# Patient Record
Sex: Male | Born: 1985
Health system: Southern US, Community
[De-identification: ages and names within clinical notes are randomized; demographics above are authoritative.]

## PROBLEM LIST (undated history)

## (undated) DIAGNOSIS — Z87442 Personal history of urinary calculi: Secondary | ICD-10-CM

## (undated) DIAGNOSIS — N200 Calculus of kidney: Secondary | ICD-10-CM

---
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
## 2018-09-27 DIAGNOSIS — R61 Generalized hyperhidrosis: Secondary | ICD-10-CM | POA: Diagnosis not present

## 2019-05-29 ENCOUNTER — Other Ambulatory Visit: Payer: Self-pay | Admitting: Urology

## 2019-05-29 DIAGNOSIS — R310 Gross hematuria: Secondary | ICD-10-CM

## 2019-06-08 ENCOUNTER — Ambulatory Visit: Payer: 59

## 2019-06-12 ENCOUNTER — Ambulatory Visit
Admission: RE | Admit: 2019-06-12 | Discharge: 2019-06-12 | Disposition: A | Discharge status: Home / Self Care | Payer: 59 | Source: Ambulatory Visit | Attending: Urology | Admitting: Urology

## 2019-06-12 ENCOUNTER — Other Ambulatory Visit: Payer: Self-pay

## 2019-06-12 DIAGNOSIS — R310 Gross hematuria: Secondary | ICD-10-CM | POA: Diagnosis present

## 2019-06-12 MED ORDER — IOHEXOL 300 MG/ML  SOLN
150.0000 mL | Freq: Once | INTRAMUSCULAR | Status: AC | PRN
  Administered 2019-06-12: 150 mL via INTRAVENOUS

## 2019-06-25 ENCOUNTER — Encounter: Payer: Self-pay | Admitting: *Deleted

## 2019-06-26 MED ORDER — SUGAMMADEX SODIUM 200 MG/2ML IV SOLN
INTRAVENOUS | Status: AC
  Filled 2019-06-26: qty 2

## 2019-06-27 MED ORDER — PROPOFOL 500 MG/50ML IV EMUL
INTRAVENOUS | Status: AC
  Filled 2019-06-27: qty 100

## 2019-06-29 MED ORDER — EPHEDRINE SULFATE 50 MG/ML IJ SOLN
INTRAMUSCULAR | Status: AC
  Filled 2019-06-29: qty 1

## 2019-06-29 MED ORDER — GLYCOPYRROLATE 0.2 MG/ML IJ SOLN
INTRAMUSCULAR | Status: AC
  Filled 2019-06-29: qty 1

## 2019-06-29 MED ORDER — LIDOCAINE HCL (PF) 2 % IJ SOLN
INTRAMUSCULAR | Status: AC
  Filled 2019-06-29: qty 10

## 2019-06-29 MED ORDER — PROPOFOL 500 MG/50ML IV EMUL
INTRAVENOUS | Status: AC
  Filled 2019-06-29: qty 200

## 2019-07-05 ENCOUNTER — Ambulatory Visit
Admission: RE | Admit: 2019-07-05 | Discharge: 2019-07-05 | Disposition: A | Discharge status: Home / Self Care | Payer: 59 | Attending: Urology | Admitting: Urology

## 2019-07-05 ENCOUNTER — Ambulatory Visit: Admit: 2019-07-05 | Payer: 59 | Admitting: Urology

## 2019-07-05 ENCOUNTER — Other Ambulatory Visit: Payer: Self-pay

## 2019-07-05 ENCOUNTER — Encounter
Admission: RE | Disposition: A | Discharge status: Home / Self Care | Payer: Self-pay | Source: Home / Self Care | Attending: Urology

## 2019-07-05 DIAGNOSIS — N200 Calculus of kidney: Secondary | ICD-10-CM | POA: Insufficient documentation

## 2019-07-05 DIAGNOSIS — N201 Calculus of ureter: Secondary | ICD-10-CM | POA: Diagnosis present

## 2019-07-05 DIAGNOSIS — Z87891 Personal history of nicotine dependence: Secondary | ICD-10-CM | POA: Insufficient documentation

## 2019-07-05 HISTORY — PX: EXTRACORPOREAL SHOCK WAVE LITHOTRIPSY: SHX1557

## 2019-07-05 SURGERY — LITHOTRIPSY, ESWL
Anesthesia: Moderate Sedation | Laterality: Left

## 2019-07-05 MED ORDER — MORPHINE SULFATE (PF) 10 MG/ML IV SOLN
INTRAVENOUS | Status: AC
  Administered 2019-07-05: 14:00:00 10 mg via INTRAMUSCULAR
  Filled 2019-07-05: qty 1

## 2019-07-05 MED ORDER — DIPHENHYDRAMINE HCL 25 MG PO CAPS
ORAL_CAPSULE | ORAL | Status: AC
  Administered 2019-07-05: 14:00:00 25 mg via ORAL
  Filled 2019-07-05: qty 1

## 2019-07-05 MED ORDER — FUROSEMIDE 10 MG/ML IJ SOLN
10.0000 mg | Freq: Once | INTRAMUSCULAR | Status: AC
  Administered 2019-07-05: 17:00:00 10 mg via INTRAVENOUS

## 2019-07-05 MED ORDER — FUROSEMIDE 10 MG/ML IJ SOLN
INTRAMUSCULAR | Status: AC
  Administered 2019-07-05: 17:00:00 10 mg via INTRAVENOUS
  Filled 2019-07-05: qty 2

## 2019-07-05 MED ORDER — DIPHENHYDRAMINE HCL 25 MG PO CAPS
25.0000 mg | ORAL_CAPSULE | ORAL | Status: AC
  Administered 2019-07-05: 14:00:00 25 mg via ORAL

## 2019-07-05 MED ORDER — PROMETHAZINE HCL 25 MG/ML IJ SOLN
INTRAMUSCULAR | Status: AC
  Administered 2019-07-05: 14:00:00 25 mg via INTRAMUSCULAR
  Filled 2019-07-05: qty 1

## 2019-07-05 MED ORDER — MIDAZOLAM HCL 2 MG/2ML IJ SOLN
1.0000 mg | Freq: Once | INTRAMUSCULAR | Status: AC
  Administered 2019-07-05: 1 mg via INTRAMUSCULAR

## 2019-07-05 MED ORDER — LEVOFLOXACIN 500 MG PO TABS
ORAL_TABLET | ORAL | Status: AC
  Administered 2019-07-05: 14:00:00 500 mg via ORAL
  Filled 2019-07-05: qty 1

## 2019-07-05 MED ORDER — ONDANSETRON HCL 4 MG PO TABS
ORAL_TABLET | ORAL | Status: AC
  Administered 2019-07-05: 18:00:00 4 mg via ORAL
  Filled 2019-07-05: qty 1

## 2019-07-05 MED ORDER — TAMSULOSIN HCL 0.4 MG PO CAPS: 0.4000 mg | ORAL_CAPSULE | Freq: Every day | ORAL | 1 refills | Status: DC

## 2019-07-05 MED ORDER — PROMETHAZINE HCL 25 MG/ML IJ SOLN
25.0000 mg | Freq: Once | INTRAMUSCULAR | Status: AC
  Administered 2019-07-05: 25 mg via INTRAMUSCULAR

## 2019-07-05 MED ORDER — MORPHINE SULFATE (PF) 10 MG/ML IV SOLN
10.0000 mg | Freq: Once | INTRAVENOUS | Status: AC
  Administered 2019-07-05: 14:00:00 10 mg via INTRAMUSCULAR

## 2019-07-05 MED ORDER — MIDAZOLAM HCL 2 MG/2ML IJ SOLN
INTRAMUSCULAR | Status: AC
  Administered 2019-07-05: 14:00:00 1 mg via INTRAMUSCULAR
  Filled 2019-07-05: qty 2

## 2019-07-05 MED ORDER — ACETAMINOPHEN-CODEINE #3 300-30 MG PO TABS: 1.0000 | ORAL_TABLET | ORAL | 0 refills | Status: DC | PRN

## 2019-07-05 MED ORDER — LEVOFLOXACIN 500 MG PO TABS
500.0000 mg | ORAL_TABLET | Freq: Every day | ORAL | Status: DC
  Administered 2019-07-05: 500 mg via ORAL

## 2019-07-05 MED ORDER — DEXTROSE-NACL 5-0.45 % IV SOLN
INTRAVENOUS | Status: DC
  Administered 2019-07-05: 14:00:00 via INTRAVENOUS

## 2019-07-05 MED ORDER — ONDANSETRON HCL 4 MG PO TABS
4.0000 mg | ORAL_TABLET | Freq: Once | ORAL | Status: AC
  Administered 2019-07-05: 18:00:00 4 mg via ORAL

## 2019-07-05 NOTE — Progress Notes (Signed)
Patient extremely drowsy. Oxygen level dropped to 69% on RA , placed on 4 L [place]. Did come up to 100%. Patient snoring loudly. Noticed tongue does go to the roof of his mouth obstructing his airway. EtCO2 42.   Wife notified patient is out and very sleepy. Will call her once patient wakes up more.

## 2019-07-05 NOTE — Discharge Instructions (Signed)
Kidney Stones ° °Kidney stones (urolithiasis) are solid, rock-like deposits that form inside of the organs that make urine (kidneys). A kidney stone may form in a kidney and move into the bladder, where it can cause intense pain and block the flow of urine. Kidney stones are created when high levels of certain minerals are found in the urine. They are usually passed through urination, but in some cases, medical treatment may be needed to remove them. °What are the causes? °Kidney stones may be caused by: °· A condition in which certain glands produce too much parathyroid hormone (primary hyperparathyroidism), which causes too much calcium buildup in the blood. °· Buildup of uric acid crystals in the bladder (hyperuricosuria). Uric acid is a chemical that the body produces when you eat certain foods. It usually exits the body in the urine. °· Narrowing (stricture) of one or both of the tubes that drain urine from the kidneys to the bladder (ureters). °· A kidney blockage that is present at birth (congenital obstruction). °· Past surgery on the kidney or the ureters, such as gastric bypass surgery. °What increases the risk? °The following factors make you more likely to develop kidney stones: °· Having had a kidney stone in the past. °· Having a family history of kidney stones. °· Not drinking enough water. °· Eating a diet that is high in protein, salt (sodium), or sugar. °· Being overweight or obese. °What are the signs or symptoms? °Symptoms of a kidney stone may include: °· Nausea. °· Vomiting. °· Blood in the urine (hematuria). °· Pain in the side of the abdomen, right below the ribs (flank pain). Pain usually spreads (radiates) to the groin. °· Needing to urinate frequently or urgently. °How is this diagnosed? °This condition may be diagnosed based on: °· Your medical history. °· A physical exam. °· Blood tests. °· Urine tests. °· CT scan. °· Abdominal X-ray. °· A procedure to examine the inside of the bladder  (cystoscopy). °How is this treated? °Treatment for kidney stones depends on the size, location, and makeup of the stones. Treatment may involve: °· Analyzing your urine before and after you pass the stone through urination. °· Being monitored at the hospital until you pass the stone through urination. °· Increasing your fluid intake and decreasing the amount of calcium and protein in your diet. °· A procedure to break up kidney stones in the bladder using: °? A focused beam of light (laser therapy). °? Shock waves (extracorporeal shock wave lithotripsy). °· Surgery to remove kidney stones. This may be needed if you have severe pain or have stones that block your urinary tract. °Follow these instructions at home: °Eating and drinking °· Drink enough fluid to keep your urine clear or pale yellow. This will help you to pass the kidney stone. °· If directed, change your diet. This may include: °? Limiting how much sodium you eat. °? Eating more fruits and vegetables. °? Limiting how much meat, poultry, fish, and eggs you eat. °· Follow instructions from your health care provider about eating or drinking restrictions. °General instructions °· Collect urine samples as told by your health care provider. You may need to collect a urine sample: °? 24 hours after you pass the stone. °? 8-12 weeks after passing the kidney stone, and every 6-12 months after that. °· Strain your urine every time you urinate, for as long as directed. Use the strainer that your health care provider recommends. °· Do not throw out the kidney stone after passing   it. Keep the stone so it can be tested by your health care provider. Testing the makeup of your kidney stone may help prevent you from getting kidney stones in the future. °· Take over-the-counter and prescription medicines only as told by your health care provider. °· Keep all follow-up visits as told by your health care provider. This is important. You may need follow-up X-rays or  ultrasounds to make sure that your stone has passed. °How is this prevented? °To prevent another kidney stone: °· Drink enough fluid to keep your urine clear or pale yellow. This is the best way to prevent kidney stones. °· Eat a healthy diet and follow recommendations from your health care provider about foods to avoid. You may be instructed to eat a low-protein diet. Recommendations vary depending on the type of kidney stone that you have. °· Maintain a healthy weight. °Contact a health care provider if: °· You have pain that gets worse or does not get better with medicine. °Get help right away if: °· You have a fever or chills. °· You develop severe pain. °· You develop new abdominal pain. °· You faint. °· You are unable to urinate. °This information is not intended to replace advice given to you by your health care provider. Make sure you discuss any questions you have with your health care provider. °Document Released: 12/06/2005 Document Revised: 07/18/2018 Document Reviewed: 05/21/2016 °[employer] Patient Education © 2020 [employer] ° °Lithotripsy, Care After °This sheet gives you information about how to care for yourself after your procedure. Your health care provider may also give you more specific instructions. If you have problems or questions, contact your health care provider. °What can I expect after the procedure? °After the procedure, it is common to have: °· Some blood in your urine. This should only last for a few days. °· Soreness in your back, sides, or upper abdomen for a few days. °· Blotches or bruises on your back where the pressure wave entered the skin. °· Pain, discomfort, or nausea when pieces (fragments) of the kidney stone move through the tube that carries urine from the kidney to the bladder (ureter). Stone fragments may pass soon after the procedure, but they may continue to pass for up to 4-8 weeks. °? If you have severe pain or nausea, contact your health care provider. This may  be caused by a large stone that was not broken up, and this may mean that you need more treatment. °· Some pain or discomfort during urination. °· Some pain or discomfort in the lower abdomen or (in men) at the base of the penis. °Follow these instructions at home: °Medicines °· Take over-the-counter and prescription medicines only as told by your health care provider. °· If you were prescribed an antibiotic medicine, take it as told by your health care provider. Do not stop taking the antibiotic even if you start to feel better. °· Do not drive for 24 hours if you were given a medicine to help you relax (sedative). °· Do not drive or use heavy machinery while taking prescription pain medicine. °Eating and drinking ° °  ° °· Drink enough water and fluids to keep your urine clear or pale yellow. This helps any remaining pieces of the stone to pass. It can also help prevent new stones from forming. °· Eat plenty of fresh fruits and vegetables. °· Follow instructions from your health care provider about eating and drinking restrictions. You may be instructed: °? To reduce how much   salt (sodium) you eat or drink. Check ingredients and nutrition facts on packaged foods and beverages. °? To reduce how much meat you eat. °· Eat the recommended amount of calcium for your age and gender. Ask your health care provider how much calcium you should have. °General instructions °· Get plenty of rest. °· Most people can resume normal activities 1-2 days after the procedure. Ask your health care provider what activities are safe for you. °· Your health care provider may direct you to lie in a certain position (postural drainage) and tap firmly (percuss) over your kidney area to help stone fragments pass. Follow instructions as told by your health care provider. °· If directed, strain all urine through the strainer that was provided by your health care provider. °? Keep all fragments for your health care provider to see. Any stones  that are found may be sent to a medical lab for examination. The stone may be as small as a grain of salt. °· Keep all follow-up visits as told by your health care provider. This is important. °Contact a health care provider if: °· You have pain that is severe or does not get better with medicine. °· You have nausea that is severe or does not go away. °· You have blood in your urine longer than your health care provider told you to expect. °· You have more blood in your urine. °· You have pain during urination that does not go away. °· You urinate more frequently than usual and this does not go away. °· You develop a rash or any other possible signs of an allergic reaction. °Get help right away if: °· You have severe pain in your back, sides, or upper abdomen. °· You have severe pain while urinating. °· Your urine is very dark red. °· You have blood in your stool (feces). °· You cannot pass any urine at all. °· You feel a strong urge to urinate after emptying your bladder. °· You have a fever or chills. °· You develop shortness of breath, difficulty breathing, or chest pain. °· You have severe nausea that leads to persistent vomiting. °· You faint. °Summary °· After this procedure, it is common to have some pain, discomfort, or nausea when pieces (fragments) of the kidney stone move through the tube that carries urine from the kidney to the bladder (ureter). If this pain or nausea is severe, however, you should contact your health care provider. °· Most people can resume normal activities 1-2 days after the procedure. Ask your health care provider what activities are safe for you. °· Drink enough water and fluids to keep your urine clear or pale yellow. This helps any remaining pieces of the stone to pass, and it can help prevent new stones from forming. °· If directed, strain your urine and keep all fragments for your health care provider to see. Fragments or stones may be as small as a grain of salt. °· Get help  right away if you have severe pain in your back, sides, or upper abdomen or have severe pain while urinating. °This information is not intended to replace advice given to you by your health care provider. Make sure you discuss any questions you have with your health care provider. °Document Released: 12/26/2007 Document Revised: 03/19/2019 Document Reviewed: 10/27/2016 °[employer] Patient Education © 2020 [employer] ° °

## 2019-07-06 ENCOUNTER — Encounter: Payer: Self-pay | Admitting: Urology

## 2019-09-12 ENCOUNTER — Other Ambulatory Visit: Payer: Self-pay

## 2019-09-12 DIAGNOSIS — Z20822 Contact with and (suspected) exposure to covid-19: Secondary | ICD-10-CM

## 2019-09-13 LAB — NOVEL CORONAVIRUS, NAA: SARS-CoV-2, NAA: NOT DETECTED

## 2019-10-15 ENCOUNTER — Other Ambulatory Visit: Payer: Self-pay

## 2019-10-17 ENCOUNTER — Other Ambulatory Visit: Payer: Self-pay

## 2019-10-17 ENCOUNTER — Encounter: Payer: Self-pay | Admitting: Internal Medicine

## 2019-10-17 ENCOUNTER — Ambulatory Visit: Payer: 59 | Admitting: Internal Medicine

## 2019-10-17 VITALS — BP 126/84 | HR 62 | Temp 98.3°F | Ht 69.0 in | Wt 178.8 lb

## 2019-10-17 DIAGNOSIS — Z1329 Encounter for screening for other suspected endocrine disorder: Secondary | ICD-10-CM | POA: Diagnosis not present

## 2019-10-17 DIAGNOSIS — Z1389 Encounter for screening for other disorder: Secondary | ICD-10-CM

## 2019-10-17 DIAGNOSIS — Z13220 Encounter for screening for lipoid disorders: Secondary | ICD-10-CM | POA: Diagnosis not present

## 2019-10-17 DIAGNOSIS — Q2112 Patent foramen ovale: Secondary | ICD-10-CM

## 2019-10-17 DIAGNOSIS — R42 Dizziness and giddiness: Secondary | ICD-10-CM | POA: Insufficient documentation

## 2019-10-17 DIAGNOSIS — Q211 Atrial septal defect: Secondary | ICD-10-CM

## 2019-10-17 DIAGNOSIS — R030 Elevated blood-pressure reading, without diagnosis of hypertension: Secondary | ICD-10-CM | POA: Insufficient documentation

## 2019-10-17 DIAGNOSIS — Z0000 Encounter for general adult medical examination without abnormal findings: Secondary | ICD-10-CM

## 2019-10-17 DIAGNOSIS — E559 Vitamin D deficiency, unspecified: Secondary | ICD-10-CM | POA: Diagnosis not present

## 2019-10-17 DIAGNOSIS — R079 Chest pain, unspecified: Secondary | ICD-10-CM | POA: Insufficient documentation

## 2019-10-17 NOTE — Patient Instructions (Addendum)
Goal blood pressure <130/<80 [place] [address] [place] [facility]   Nonspecific Chest Pain, Adult Chest pain can be caused by many different conditions. It can be caused by a condition that is life-threatening and requires treatment right away. It can also be caused by something that is not life-threatening. If you have chest pain, it can be hard to know the difference, so it is important to get help right away to make sure that you do not have a serious condition. Some life-threatening causes of chest pain include:  Heart attack.  A tear in the body's main blood vessel (aortic dissection).  Inflammation around your heart (pericarditis).  A problem in the lungs, such as a blood clot (pulmonary embolism) or a collapsed lung (pneumothorax). Some non life-threatening causes of chest pain include:  Heartburn.  Anxiety or stress.  Damage to the bones, muscles, and cartilage that make up your chest wall.  Pneumonia or bronchitis.  Shingles infection (varicella-zoster virus). Chest pain can feel like:  Pain or discomfort on the surface of your chest or deep in your chest.  Crushing, pressure, aching, or squeezing pain.  Burning or tingling.  Dull or sharp pain that is worse when you move, cough, or take a deep breath.  Pain or discomfort that is also felt in your back, neck, jaw, shoulder, or arm, or pain that spreads to any of these areas. Your chest pain may come and go. It may also be constant. Your health care provider will do lab tests and other studies to find the cause of your pain. Treatment will depend on the cause of your chest pain. Follow these instructions at home: Medicines  Take over-the-counter and prescription medicines only as told by your health care provider.  If you were prescribed an antibiotic, take it as told by your health care provider. Do not stop taking the antibiotic even if you start to feel better. Lifestyle   Rest as directed by  your health care provider.  Do not use any products that contain nicotine or tobacco, such as cigarettes and e-cigarettes. If you need help quitting, ask your health care provider.  Do not drink alcohol.  Make healthy lifestyle choices as recommended. These may include: ? Getting regular exercise. Ask your health care provider to suggest some activities that are safe for you. ? Eating a heart-healthy diet. This includes plenty of fresh fruits and vegetables, whole grains, low-fat (lean) protein, and low-fat dairy products. A dietitian can help you find healthy eating options. ? Maintaining a healthy weight. ? Managing any other health conditions you have, such as high blood pressure (hypertension) or diabetes. ? Reducing stress, such as with yoga or relaxation techniques. General instructions  Pay attention to any changes in your symptoms. Tell your health care provider about them or any new symptoms.  Avoid any activities that cause chest pain.  Keep all follow-up visits as told by your health care provider. This is important. This includes visits for any further testing if your chest pain does not go away. Contact a health care provider if:  Your chest pain does not go away.  You feel depressed.  You have a fever. Get help right away if:  Your chest pain gets worse.  You have a cough that gets worse, or you cough up blood.  You have severe pain in your abdomen.  You faint.  You have sudden, unexplained chest discomfort.  You have sudden, unexplained discomfort in your arms, back, neck, or  jaw.  You have shortness of breath at any time.  You suddenly start to sweat, or your skin gets clammy.  You feel nausea or you vomit.  You suddenly feel lightheaded or dizzy.  You have severe weakness, or unexplained weakness or fatigue.  Your heart begins to beat quickly, or it feels like it is skipping beats. These symptoms may represent a serious problem that is an emergency.  Do not wait to see if the symptoms will go away. Get medical help right away. Call your local emergency services (911 in the [place] Do not drive yourself to the hospital. Summary  Chest pain can be caused by a condition that is serious and requires urgent treatment. It may also be caused by something that is not life-threatening.  If you have chest pain, it is very important to see your health care provider. Your health care provider may do lab tests and other studies to find the cause of your pain.  Follow your health care provider's instructions on taking medicines, making lifestyle changes, and getting emergency treatment if symptoms become worse.  Keep all follow-up visits as told by your health care provider. This includes visits for any further testing if your chest pain does not go away. This information is not intended to replace advice given to you by your health care provider. Make sure you discuss any questions you have with your health care provider. Document Released: 09/15/2005 Document Revised: 06/08/2018 Document Reviewed: 06/08/2018 Elsevier Patient Education  2020 [employer].  Hypertension, Adult High blood pressure (hypertension) is when the force of blood pumping through the arteries is too strong. The arteries are the blood vessels that carry blood from the heart throughout the body. Hypertension forces the heart to work harder to pump blood and may cause arteries to become narrow or stiff. Untreated or uncontrolled hypertension can cause a heart attack, heart failure, a stroke, kidney disease, and other problems. A blood pressure reading consists of a higher number over a lower number. Ideally, your blood pressure should be below 120/80. The first ("top") number is called the systolic pressure. It is a measure of the pressure in your arteries as your heart beats. The second ("bottom") number is called the diastolic pressure. It is a measure of the pressure in your arteries as  the heart relaxes. What are the causes? The exact cause of this condition is not known. There are some conditions that result in or are related to high blood pressure. What increases the risk? Some risk factors for high blood pressure are under your control. The following factors may make you more likely to develop this condition:  Smoking.  Having type 2 diabetes mellitus, high cholesterol, or both.  Not getting enough exercise or physical activity.  Being overweight.  Having too much fat, sugar, calories, or salt (sodium) in your diet.  Drinking too much alcohol. Some risk factors for high blood pressure may be difficult or impossible to change. Some of these factors include:  Having chronic kidney disease.  Having a family history of high blood pressure.  Age. Risk increases with age.  Race. You may be at higher risk if you are African American.  Gender. Men are at higher risk than women before age 43. After age 26, women are at higher risk than men.  Having obstructive sleep apnea.  Stress. What are the signs or symptoms? High blood pressure may not cause symptoms. Very high blood pressure (hypertensive crisis) may cause:  Headache.  Anxiety.  Shortness of breath.  Nosebleed.  Nausea and vomiting.  Vision changes.  Severe chest pain.  Seizures. How is this diagnosed? This condition is diagnosed by measuring your blood pressure while you are seated, with your arm resting on a flat surface, your legs uncrossed, and your feet flat on the floor. The cuff of the blood pressure monitor will be placed directly against the skin of your upper arm at the level of your heart. It should be measured at least twice using the same arm. Certain conditions can cause a difference in blood pressure between your right and left arms. Certain factors can cause blood pressure readings to be lower or higher than normal for a short period of time:  When your blood pressure is higher  when you are in a health care provider's office than when you are at home, this is called white coat hypertension. Most people with this condition do not need medicines.  When your blood pressure is higher at home than when you are in a health care provider's office, this is called masked hypertension. Most people with this condition may need medicines to control blood pressure. If you have a high blood pressure reading during one visit or you have normal blood pressure with other risk factors, you may be asked to:  Return on a different day to have your blood pressure checked again.  Monitor your blood pressure at home for 1 week or longer. If you are diagnosed with hypertension, you may have other blood or imaging tests to help your health care provider understand your overall risk for other conditions. How is this treated? This condition is treated by making healthy lifestyle changes, such as eating healthy foods, exercising more, and reducing your alcohol intake. Your health care provider may prescribe medicine if lifestyle changes are not enough to get your blood pressure under control, and if:  Your systolic blood pressure is above 130.  Your diastolic blood pressure is above 80. Your personal target blood pressure may vary depending on your medical conditions, your age, and other factors. Follow these instructions at home: Eating and drinking   Eat a diet that is high in fiber and potassium, and low in sodium, added sugar, and fat. An example eating plan is called the DASH (Dietary Approaches to Stop Hypertension) diet. To eat this way: ? Eat plenty of fresh fruits and vegetables. Try to fill one half of your plate at each meal with fruits and vegetables. ? Eat whole grains, such as whole-wheat pasta, brown rice, or whole-grain bread. Fill about one fourth of your plate with whole grains. ? Eat or drink low-fat dairy products, such as skim milk or low-fat yogurt. ? Avoid fatty cuts of  meat, processed or cured meats, and poultry with skin. Fill about one fourth of your plate with lean proteins, such as fish, chicken without skin, beans, eggs, or tofu. ? Avoid pre-made and processed foods. These tend to be higher in sodium, added sugar, and fat.  Reduce your daily sodium intake. Most people with hypertension should eat less than 1,500 mg of sodium a day.  Do not drink alcohol if: ? Your health care provider tells you not to drink. ? You are pregnant, may be pregnant, or are planning to become pregnant.  If you drink alcohol: ? Limit how much you use to:  0-1 drink a day for women.  0-2 drinks a day for men. ? Be aware of how much alcohol is in your drink. In  the [place] one drink equals one 12 oz bottle of beer (355 mL), one 5 oz glass of wine (148 mL), or one 1 oz glass of hard liquor (44 mL). Lifestyle   Work with your health care provider to maintain a healthy body weight or to lose weight. Ask what an ideal weight is for you.  Get at least 30 minutes of exercise most days of the week. Activities may include walking, swimming, or biking.  Include exercise to strengthen your muscles (resistance exercise), such as Pilates or lifting weights, as part of your weekly exercise routine. Try to do these types of exercises for 30 minutes at least 3 days a week.  Do not use any products that contain nicotine or tobacco, such as cigarettes, e-cigarettes, and chewing tobacco. If you need help quitting, ask your health care provider.  Monitor your blood pressure at home as told by your health care provider.  Keep all follow-up visits as told by your health care provider. This is important. Medicines  Take over-the-counter and prescription medicines only as told by your health care provider. Follow directions carefully. Blood pressure medicines must be taken as prescribed.  Do not skip doses of blood pressure medicine. Doing this puts you at risk for problems and can make the  medicine less effective.  Ask your health care provider about side effects or reactions to medicines that you should watch for. Contact a health care provider if you:  Think you are having a reaction to a medicine you are taking.  Have headaches that keep coming back (recurring).  Feel dizzy.  Have swelling in your ankles.  Have trouble with your vision. Get help right away if you:  Develop a severe headache or confusion.  Have unusual weakness or numbness.  Feel faint.  Have severe pain in your chest or abdomen.  Vomit repeatedly.  Have trouble breathing. Summary  Hypertension is when the force of blood pumping through your arteries is too strong. If this condition is not controlled, it may put you at risk for serious complications.  Your personal target blood pressure may vary depending on your medical conditions, your age, and other factors. For most people, a normal blood pressure is less than 120/80.  Hypertension is treated with lifestyle changes, medicines, or a combination of both. Lifestyle changes include losing weight, eating a healthy, low-sodium diet, exercising more, and limiting alcohol. This information is not intended to replace advice given to you by your health care provider. Make sure you discuss any questions you have with your health care provider. Document Released: 12/06/2005 Document Revised: 08/16/2018 Document Reviewed: 08/16/2018 Elsevier Patient Education  2020 [place] DASH stands for "Dietary Approaches to Stop Hypertension." The DASH eating plan is a healthy eating plan that has been shown to reduce high blood pressure (hypertension). It may also reduce your risk for type 2 diabetes, heart disease, and stroke. The DASH eating plan may also help with weight loss. What are tips for following this plan?  General guidelines  Avoid eating more than 2,300 mg (milligrams) of salt (sodium) a day. If you have hypertension, you  may need to reduce your sodium intake to 1,500 mg a day.  Limit alcohol intake to no more than 1 drink a day for nonpregnant women and 2 drinks a day for men. One drink equals 12 oz of beer, 5 oz of wine, or 1 oz of hard liquor.  Work with your health care provider to maintain  a healthy body weight or to lose weight. Ask what an ideal weight is for you.  Get at least 30 minutes of exercise that causes your heart to beat faster (aerobic exercise) most days of the week. Activities may include walking, swimming, or biking.  Work with your health care provider or diet and nutrition specialist (dietitian) to adjust your eating plan to your individual calorie needs. Reading food labels   Check food labels for the amount of sodium per serving. Choose foods with less than 5 percent of the Daily Value of sodium. Generally, foods with less than 300 mg of sodium per serving fit into this eating plan.  To find whole grains, look for the word "whole" as the first word in the ingredient list. Shopping  Buy products labeled as "low-sodium" or "no salt added."  Buy fresh foods. Avoid canned foods and premade or frozen meals. Cooking  Avoid adding salt when cooking. Use salt-free seasonings or herbs instead of table salt or sea salt. Check with your health care provider or pharmacist before using salt substitutes.  Do not fry foods. Cook foods using healthy methods such as baking, boiling, grilling, and broiling instead.  Cook with heart-healthy oils, such as olive, canola, soybean, or sunflower oil. Meal planning  Eat a balanced diet that includes: ? 5 or more servings of fruits and vegetables each day. At each meal, try to fill half of your plate with fruits and vegetables. ? Up to 6-8 servings of whole grains each day. ? Less than 6 oz of lean meat, poultry, or fish each day. A 3-oz serving of meat is about the same size as a deck of cards. One egg equals 1 oz. ? 2 servings of low-fat dairy each  day. ? A serving of nuts, seeds, or beans 5 times each week. ? Heart-healthy fats. Healthy fats called Omega-3 fatty acids are found in foods such as flaxseeds and coldwater fish, like sardines, salmon, and mackerel.  Limit how much you eat of the following: ? Canned or prepackaged foods. ? Food that is high in trans fat, such as fried foods. ? Food that is high in saturated fat, such as fatty meat. ? Sweets, desserts, sugary drinks, and other foods with added sugar. ? Full-fat dairy products.  Do not salt foods before eating.  Try to eat at least 2 vegetarian meals each week.  Eat more home-cooked food and less restaurant, buffet, and fast food.  When eating at a restaurant, ask that your food be prepared with less salt or no salt, if possible. What foods are recommended? The items listed may not be a complete list. Talk with your dietitian about what dietary choices are best for you. Grains Whole-grain or whole-wheat bread. Whole-grain or whole-wheat pasta. Brown rice. Orpah Cobb. Bulgur. Whole-grain and low-sodium cereals. Pita bread. Low-fat, low-sodium crackers. Whole-wheat flour tortillas. Vegetables Fresh or frozen vegetables (raw, steamed, roasted, or grilled). Low-sodium or reduced-sodium tomato and vegetable juice. Low-sodium or reduced-sodium tomato sauce and tomato paste. Low-sodium or reduced-sodium canned vegetables. Fruits All fresh, dried, or frozen fruit. Canned fruit in natural juice (without added sugar). Meat and other protein foods Skinless chicken or [place]. Ground chicken or [place]. Pork with fat trimmed off. Fish and seafood. Egg whites. Dried beans, peas, or lentils. Unsalted nuts, nut butters, and seeds. Unsalted canned beans. Lean cuts of beef with fat trimmed off. Low-sodium, lean deli meat. Dairy Low-fat (1%) or fat-free (skim) milk. Fat-free, low-fat, or reduced-fat cheeses. Nonfat, low-sodium ricotta  or cottage cheese. Low-fat or nonfat yogurt.  Low-fat, low-sodium cheese. Fats and oils Soft margarine without trans fats. Vegetable oil. Low-fat, reduced-fat, or light mayonnaise and salad dressings (reduced-sodium). Canola, safflower, olive, soybean, and sunflower oils. Avocado. Seasoning and other foods Herbs. Spices. Seasoning mixes without salt. Unsalted popcorn and pretzels. Fat-free sweets. What foods are not recommended? The items listed may not be a complete list. Talk with your dietitian about what dietary choices are best for you. Grains Baked goods made with fat, such as croissants, muffins, or some breads. Dry pasta or rice meal packs. Vegetables Creamed or fried vegetables. Vegetables in a cheese sauce. Regular canned vegetables (not low-sodium or reduced-sodium). Regular canned tomato sauce and paste (not low-sodium or reduced-sodium). Regular tomato and vegetable juice (not low-sodium or reduced-sodium). Rosita Fire. Olives. Fruits Canned fruit in a light or heavy syrup. Fried fruit. Fruit in cream or butter sauce. Meat and other protein foods Fatty cuts of meat. Ribs. Fried meat. Tomasa Blase. Sausage. Bologna and other processed lunch meats. Salami. Fatback. Hotdogs. Bratwurst. Salted nuts and seeds. Canned beans with added salt. Canned or smoked fish. Whole eggs or egg yolks. Chicken or [place] with skin. Dairy Whole or 2% milk, cream, and half-and-half. Whole or full-fat cream cheese. Whole-fat or sweetened yogurt. Full-fat cheese. Nondairy creamers. Whipped toppings. Processed cheese and cheese spreads. Fats and oils Butter. Stick margarine. Lard. Shortening. Ghee. Bacon fat. Tropical oils, such as coconut, palm kernel, or palm oil. Seasoning and other foods Salted popcorn and pretzels. Onion salt, garlic salt, seasoned salt, table salt, and sea salt. Worcestershire sauce. Tartar sauce. Barbecue sauce. Teriyaki sauce. Soy sauce, including reduced-sodium. Steak sauce. Canned and packaged gravies. Fish sauce. Oyster sauce. Cocktail  sauce. Horseradish that you find on the shelf. Ketchup. Mustard. Meat flavorings and tenderizers. Bouillon cubes. Hot sauce and Tabasco sauce. Premade or packaged marinades. Premade or packaged taco seasonings. Relishes. Regular salad dressings. Where to find more information:  National Heart, Lung, and Blood Institute: PopSteam.is  American Heart Association: www.heart.org Summary  The DASH eating plan is a healthy eating plan that has been shown to reduce high blood pressure (hypertension). It may also reduce your risk for type 2 diabetes, heart disease, and stroke.  With the DASH eating plan, you should limit salt (sodium) intake to 2,300 mg a day. If you have hypertension, you may need to reduce your sodium intake to 1,500 mg a day.  When on the DASH eating plan, aim to eat more fresh fruits and vegetables, whole grains, lean proteins, low-fat dairy, and heart-healthy fats.  Work with your health care provider or diet and nutrition specialist (dietitian) to adjust your eating plan to your individual calorie needs. This information is not intended to replace advice given to you by your health care provider. Make sure you discuss any questions you have with your health care provider. Document Released: 11/25/2011 Document Revised: 11/18/2017 Document Reviewed: 11/29/2016 Elsevier Patient Education  2020 [employer]  Dizziness Dizziness is a common problem. It is a feeling of unsteadiness or light-headedness. You may feel like you are about to faint. Dizziness can lead to injury if you stumble or fall. Anyone can become dizzy, but dizziness is more common in older adults. This condition can be caused by a number of things, including medicines, dehydration, or illness. Follow these instructions at home: Eating and drinking  Drink enough fluid to keep your urine clear or pale yellow. This helps to keep you from becoming dehydrated. Try to drink more clear  fluids, such as water.  Do  not drink alcohol.  Limit your caffeine intake if told to do so by your health care provider. Check ingredients and nutrition facts to see if a food or beverage contains caffeine.  Limit your salt (sodium) intake if told to do so by your health care provider. Check ingredients and nutrition facts to see if a food or beverage contains sodium. Activity  Avoid making quick movements. ? Rise slowly from chairs and steady yourself until you feel okay. ? In the morning, first sit up on the side of the bed. When you feel okay, stand slowly while you hold onto something until you know that your balance is fine.  If you need to stand in one place for a long time, move your legs often. Tighten and relax the muscles in your legs while you are standing.  Do not drive or use heavy machinery if you feel dizzy.  Avoid bending down if you feel dizzy. Place items in your home so that they are easy for you to reach without leaning over. Lifestyle  Do not use any products that contain nicotine or tobacco, such as cigarettes and e-cigarettes. If you need help quitting, ask your health care provider.  Try to reduce your stress level by using methods such as yoga or meditation. Talk with your health care provider if you need help to manage your stress. General instructions  Watch your dizziness for any changes.  Take over-the-counter and prescription medicines only as told by your health care provider. Talk with your health care provider if you think that your dizziness is caused by a medicine that you are taking.  Tell a friend or a family member that you are feeling dizzy. If he or she notices any changes in your behavior, have this person call your health care provider.  Keep all follow-up visits as told by your health care provider. This is important. Contact a health care provider if:  Your dizziness does not go away.  Your dizziness or light-headedness gets worse.  You feel nauseous.  You have  reduced hearing.  You have new symptoms.  You are unsteady on your feet or you feel like the room is spinning. Get help right away if:  You vomit or have diarrhea and are unable to eat or drink anything.  You have problems talking, walking, swallowing, or using your arms, hands, or legs.  You feel generally weak.  You are not thinking clearly or you have trouble forming sentences. It may take a friend or family member to notice this.  You have chest pain, abdominal pain, shortness of breath, or sweating.  Your vision changes.  You have any bleeding.  You have a severe headache.  You have neck pain or a stiff neck.  You have a fever. These symptoms may represent a serious problem that is an emergency. Do not wait to see if the symptoms will go away. Get medical help right away. Call your local emergency services (911 in the [place] Do not drive yourself to the hospital. Summary  Dizziness is a feeling of unsteadiness or light-headedness. This condition can be caused by a number of things, including medicines, dehydration, or illness.  Anyone can become dizzy, but dizziness is more common in older adults.  Drink enough fluid to keep your urine clear or pale yellow. Do not drink alcohol.  Avoid making quick movements if you feel dizzy. Monitor your dizziness for any changes. This information is not intended  to replace advice given to you by your health care provider. Make sure you discuss any questions you have with your health care provider. Document Released: 06/01/2001 Document Revised: 12/09/2017 Document Reviewed: 01/08/2017 Elsevier Patient Education  2020 Else[employer] Inc.  Dietary Guidelines to Help Prevent Kidney Stones Kidney stones are deposits of minerals and salts that form inside your kidneys. Your risk of developing kidney stones may be greater depending on your diet, your lifestyle, the medicines you take, and whether you have certain medical conditions. Most people  can reduce their chances of developing kidney stones by following the instructions below. Depending on your overall health and the type of kidney stones you tend to develop, your dietitian may give you more specific instructions. What are tips for following this plan? Reading food labels  Choose foods with "no salt added" or "low-salt" labels. Limit your sodium intake to less than 1500 mg per day.  Choose foods with calcium for each meal and snack. Try to eat about 300 mg of calcium at each meal. Foods that contain 200-500 mg of calcium per serving include: ? 8 oz (237 ml) of milk, fortified nondairy milk, and fortified fruit juice. ? 8 oz (237 ml) of kefir, yogurt, and soy yogurt. ? 4 oz (118 ml) of tofu. ? 1 oz of cheese. ? 1 cup (300 g) of dried figs. ? 1 cup (91 g) of cooked broccoli. ? 1-3 oz can of sardines or mackerel.  Most people need 1000 to 1500 mg of calcium each day. Talk to your dietitian about how much calcium is recommended for you. Shopping  Buy plenty of fresh fruits and vegetables. Most people do not need to avoid fruits and vegetables, even if they contain nutrients that may contribute to kidney stones.  When shopping for convenience foods, choose: ? Whole pieces of fruit. ? Premade salads with dressing on the side. ? Low-fat fruit and yogurt smoothies.  Avoid buying frozen meals or prepared deli foods.  Look for foods with live cultures, such as yogurt and kefir. Cooking  Do not add salt to food when cooking. Place a salt shaker on the table and allow each person to add his or her own salt to taste.  Use vegetable protein, such as beans, textured vegetable protein (TVP), or tofu instead of meat in pasta, casseroles, and soups. Meal planning   Eat less salt, if told by your dietitian. To do this: ? Avoid eating processed or premade food. ? Avoid eating fast food.  Eat less animal protein, including cheese, meat, poultry, or fish, if told by your dietitian.  To do this: ? Limit the number of times you have meat, poultry, fish, or cheese each week. Eat a diet free of meat at least 2 days a week. ? Eat only one serving each day of meat, poultry, fish, or seafood. ? When you prepare animal protein, cut pieces into small portion sizes. For most meat and fish, one serving is about the size of one deck of cards.  Eat at least 5 servings of fresh fruits and vegetables each day. To do this: ? Keep fruits and vegetables on hand for snacks. ? Eat 1 piece of fruit or a handful of berries with breakfast. ? Have a salad and fruit at lunch. ? Have two kinds of vegetables at dinner.  Limit foods that are high in a substance called oxalate. These include: ? Spinach. ? Rhubarb. ? Beets. ? Potato chips and french fries. ? Nuts.  If you  regularly take a diuretic medicine, make sure to eat at least 1-2 fruits or vegetables high in potassium each day. These include: ? Avocado. ? Banana. ? Orange, prune, carrot, or tomato juice. ? Baked potato. ? Cabbage. ? Beans and split peas. General instructions   Drink enough fluid to keep your urine clear or pale yellow. This is the most important thing you can do.  Talk to your health care provider and dietitian about taking daily supplements. Depending on your health and the cause of your kidney stones, you may be advised: ? Not to take supplements with vitamin C. ? To take a calcium supplement. ? To take a daily probiotic supplement. ? To take other supplements such as magnesium, fish oil, or vitamin B6.  Take all medicines and supplements as told by your health care provider.  Limit alcohol intake to no more than 1 drink a day for nonpregnant women and 2 drinks a day for men. One drink equals 12 oz of beer, 5 oz of wine, or 1 oz of hard liquor.  Lose weight if told by your health care provider. Work with your dietitian to find strategies and an eating plan that works best for you. What foods are not  recommended? Limit your intake of the following foods, or as told by your dietitian. Talk to your dietitian about specific foods you should avoid based on the type of kidney stones and your overall health. Grains Breads. Bagels. Rolls. Baked goods. Salted crackers. Cereal. Pasta. Vegetables Spinach. Rhubarb. Beets. Canned vegetables. Rosita Fire. Olives. Meats and other protein foods Nuts. Nut butters. Large portions of meat, poultry, or fish. Salted or cured meats. Deli meats. Hot dogs. Sausages. Dairy Cheese. Beverages Regular soft drinks. Regular vegetable juice. Seasonings and other foods Seasoning blends with salt. Salad dressings. Canned soups. Soy sauce. Ketchup. Barbecue sauce. Canned pasta sauce. Casseroles. Pizza. Lasagna. Frozen meals. Potato chips. [place] fries. Summary  You can reduce your risk of kidney stones by making changes to your diet.  The most important thing you can do is drink enough fluid. You should drink enough fluid to keep your urine clear or pale yellow.  Ask your health care provider or dietitian how much protein from animal sources you should eat each day, and also how much salt and calcium you should have each day. This information is not intended to replace advice given to you by your health care provider. Make sure you discuss any questions you have with your health care provider. Document Released: 04/02/2011 Document Revised: 03/28/2019 Document Reviewed: 11/16/2016 Elsevier Patient Education  2020 Elsevier Inc.  Kidney Stones  Kidney stones (urolithiasis) are solid, rock-like deposits that form inside of the organs that make urine (kidneys). A kidney stone may form in a kidney and move into the bladder, where it can cause intense pain and block the flow of urine. Kidney stones are created when high levels of certain minerals are found in the urine. They are usually passed through urination, but in some cases, medical treatment may be needed to remove  them. What are the causes? Kidney stones may be caused by:  A condition in which certain glands produce too much parathyroid hormone (primary hyperparathyroidism), which causes too much calcium buildup in the blood.  Buildup of uric acid crystals in the bladder (hyperuricosuria). Uric acid is a chemical that the body produces when you eat certain foods. It usually exits the body in the urine.  Narrowing (stricture) of one or both of the tubes that  drain urine from the kidneys to the bladder (ureters).  A kidney blockage that is present at birth (congenital obstruction).  Past surgery on the kidney or the ureters, such as gastric bypass surgery. What increases the risk? The following factors make you more likely to develop kidney stones:  Having had a kidney stone in the past.  Having a family history of kidney stones.  Not drinking enough water.  Eating a diet that is high in protein, salt (sodium), or sugar.  Being overweight or obese. What are the signs or symptoms? Symptoms of a kidney stone may include:  Nausea.  Vomiting.  Blood in the urine (hematuria).  Pain in the side of the abdomen, right below the ribs (flank pain). Pain usually spreads (radiates) to the groin.  Needing to urinate frequently or urgently. How is this diagnosed? This condition may be diagnosed based on:  Your medical history.  A physical exam.  Blood tests.  Urine tests.  CT scan.  Abdominal X-ray.  A procedure to examine the inside of the bladder (cystoscopy). How is this treated? Treatment for kidney stones depends on the size, location, and makeup of the stones. Treatment may involve:  Analyzing your urine before and after you pass the stone through urination.  Being monitored at the hospital until you pass the stone through urination.  Increasing your fluid intake and decreasing the amount of calcium and protein in your diet.  A procedure to break up kidney stones in the  bladder using: ? A focused beam of light (laser therapy). ? Shock waves (extracorporeal shock wave lithotripsy).  Surgery to remove kidney stones. This may be needed if you have severe pain or have stones that block your urinary tract. Follow these instructions at home: Eating and drinking  Drink enough fluid to keep your urine clear or pale yellow. This will help you to pass the kidney stone.  If directed, change your diet. This may include: ? Limiting how much sodium you eat. ? Eating more fruits and vegetables. ? Limiting how much meat, poultry, fish, and eggs you eat.  Follow instructions from your health care provider about eating or drinking restrictions. General instructions  Collect urine samples as told by your health care provider. You may need to collect a urine sample: ? 24 hours after you pass the stone. ? 8-12 weeks after passing the kidney stone, and every 6-12 months after that.  Strain your urine every time you urinate, for as long as directed. Use the strainer that your health care provider recommends.  Do not throw out the kidney stone after passing it. Keep the stone so it can be tested by your health care provider. Testing the makeup of your kidney stone may help prevent you from getting kidney stones in the future.  Take over-the-counter and prescription medicines only as told by your health care provider.  Keep all follow-up visits as told by your health care provider. This is important. You may need follow-up X-rays or ultrasounds to make sure that your stone has passed. How is this prevented? To prevent another kidney stone:  Drink enough fluid to keep your urine clear or pale yellow. This is the best way to prevent kidney stones.  Eat a healthy diet and follow recommendations from your health care provider about foods to avoid. You may be instructed to eat a low-protein diet. Recommendations vary depending on the type of kidney stone that you  have.  Maintain a healthy weight. Contact a health care  provider if:  You have pain that gets worse or does not get better with medicine. Get help right away if:  You have a fever or chills.  You develop severe pain.  You develop new abdominal pain.  You faint.  You are unable to urinate. This information is not intended to replace advice given to you by your health care provider. Make sure you discuss any questions you have with your health care provider. Document Released: 12/06/2005 Document Revised: 07/18/2018 Document Reviewed: 05/21/2016 Elsevier Patient Education  2020 [employer].

## 2019-10-17 NOTE — Progress Notes (Signed)
Chief Complaint  Patient presents with  . Establish Care   New patient  1. C/o dizziness worse in the am but improving since he had his eyes and Rx changed in his glasses with Dr. Lew Dawes. He works at [employer] a lot and has to look at 2 screening for work and closing his eyes helped with dizziness. The room is not spinning no n/v but he feels off balance. Dizziness is not associated with the room spinning or chest pain and he is eating and not skipping meals, denies h/a [facility] and sugar was 97   2. Left sided chest pain/soreness last week x 2 seconds new w/o radiation no associated sx's I.e nausea/vomiting, sob, he does not think dizziness was associated. When he pressed on his chest did not reproduce pain. 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 She does get angry at times and frustrated. He does drink 3-4 cups of coffee per day  No FH heart disease   3. [facility] 126[facility]84 [facility] was 140/88 disc getting home BP cuff to monitor goal <130/<80   Review of Systems  Constitutional: Negative for weight loss.  HENT: Negative for hearing loss.   Eyes: Negative for blurred vision.  Respiratory: Negative for shortness of breath.   Cardiovascular: Positive for chest pain.  Gastrointestinal: Negative for abdominal pain and heartburn.  Musculoskeletal: Negative for falls.  Skin: Negative for rash.  Neurological: Positive for dizziness. Negative for loss of consciousness and headaches.  Psychiatric/Behavioral: Negative for depression.   Past Medical History:  Diagnosis Date  . Kidney stones    Past Surgical History:  Procedure Laterality Date  . EXTRACORPOREAL SHOCK WAVE LITHOTRIPSY Left 07/05/2019   Procedure: EXTRACORPOREAL SHOCK WAVE LITHOTRIPSY (ESWL);  Surgeon: Orson Ape, MD;  Location:  [facility] ORS;  Service: Urology;  Laterality: Left;   Family History  Problem Relation Age of Onset  . Diabetes Father    Social History   Socioeconomic History  . Marital status: Married    Spouse name: Not on file  . Number of children: Not on file  . Years of education: Not on file  . Highest education level: Not on file  Occupational History  . Not on file  Social Needs  . Financial resource strain: Not on file  . Food insecurity    Worry: Not on file    Inability: Not on file  . Transportation needs    Medical: Not on file    Non-medical: Not on file  Tobacco Use  . Smoking status: Former Smoker    Quit date: 06/24/2008    Years since quitting: 11.3  . Smokeless tobacco: Never Used  . Tobacco comment: smoker x 6 years >1 ppd and quit in 2009  Substance and Sexual Activity  . Alcohol use: Yes    Alcohol/week: 5.0 standard drinks    Types: 5 Cans of beer per week  . Drug use: Never  . Sexual activity: Yes  Lifestyle  . Physical activity    Days per week: Not on file    Minutes per session: Not on file  . Stress: Not on file  Relationships  . Social Musician on phone: Not on file    Gets together:  Not on file    Attends religious service: Not on file    Active member of club or organization: Not on file    Attends meetings of clubs or organizations: Not on file    Relationship status: Not on file  . Intimate partner violence    Fear of current or ex partner: Not on file    Emotionally abused: Not on file    Physically abused: Not on file    Forced sexual activity: Not on file  Other Topics Concern  . Not on file  Social History Narrative   Married    2 kids boy 6 y.o and 46 month old baby as of 10/17/19   From [place] lived in [place] since 2013 has lived in [place] in 2013    Bachelors degree Recruitment consultant       No outpatient medications have been marked as taking for the 10/17/19 encounter (Office Visit) with McLean-Scocuzza, Nino Glow, MD.    No Known Allergies Recent Results (from the past 2160 hour(s))  Novel Coronavirus, NAA ([facility])     Status: None   Collection Time: 09/12/19 10:04 AM   Specimen: Oropharyngeal(OP) collection in vial transport medium   OROPHARYNGEA  TESTING  Result Value Ref Range   SARS-CoV-2, NAA Not Detected Not Detected    Comment: This nucleic acid amplification test was developed and its performance characteristics determined by [employer]. Nucleic acid amplification tests include PCR and TMA. This test has not been FDA cleared or approved. This test has been authorized by FDA under an Emergency Use Authorization (EUA). This test is only authorized for the duration of time the declaration that circumstances exist justifying the authorization of the emergency use of in vitro diagnostic tests for detection of SARS-CoV-2 virus and/or diagnosis of COVID-19 infection under section 564(b)(1) of the Act, 21 U.S.C. 761YWV-3(X) (1), unless the authorization is terminated or revoked sooner. When diagnostic testing is negative, the possibility of a false negative result should be considered in the context of a patient's recent exposures and the presence of clinical signs and symptoms consistent with COVID-19. An individual without symptoms of COVID-19 and who is not shedding SARS-CoV-2 virus would  expect to have a negative (not detected) result in this assay.    Objective  Body mass index is 26.4 kg/m. Wt Readings from Last 3 Encounters:  10/17/19 178 lb 12.8 oz (81.1 kg)  06/25/19 175 lb (79.4 kg)   Temp Readings from Last 3 Encounters:  10/17/19 98.3 F (36.8 C) (Skin)  07/05/19 (!) 97.5 F (36.4 C) (Temporal)   BP Readings from Last 3 Encounters:  10/17/19 126/84  07/05/19 (P) 121/79   Pulse Readings from Last 3 Encounters:  10/17/19 62  07/05/19 (P) 70    Physical Exam Vitals signs and nursing note reviewed.  Constitutional:      Appearance: Normal appearance. He is  well-developed and well-groomed.     Comments: +mask on    HENT:     Head: Normocephalic and atraumatic.  Eyes:     Conjunctiva/sclera: Conjunctivae normal.     Pupils: Pupils are equal, round, and reactive to light.  Cardiovascular:     Rate and Rhythm: Normal rate and regular rhythm.     Heart sounds: Normal heart sounds. No murmur.  Pulmonary:     Effort: Pulmonary effort is normal.     Breath sounds: Normal breath sounds.  Abdominal:     General: Abdomen is flat. Bowel sounds are normal.  Tenderness: There is no abdominal tenderness.  Musculoskeletal:     Right lower leg: No edema.     Left lower leg: No edema.  Skin:    General: Skin is warm and dry.  Neurological:     General: No focal deficit present.     Mental Status: He is alert and oriented to person, place, and time. Mental status is at baseline.     Gait: Gait normal.  Psychiatric:        Attention and Perception: Attention and perception normal.        Mood and Affect: Mood and affect normal.        Speech: Speech normal.        Behavior: Behavior normal. Behavior is cooperative.        Thought Content: Thought content normal.        Cognition and Memory: Cognition and memory normal.        Judgment: Judgment normal.     Assessment  Plan  Dizziness ? Etiology was not related to hypoglycemia r/o cardiac vs neuro etiology in future- Plan:  Labs fasting sch Monitor BP  Refer to cards with h/o "hole in heart" in childhood ? PFO vs other r/o cardiac etiology  Recent eye exam Dr. Lew Dawesitter and vision doing better  Consider MRI brain in future to w/u   Left-sided chest pain - Plan: Ambulatory referral to Cardiology further w/u may need echo with bubble as well as other w/u  ?PFO (patent foramen ovale) vs other structural hole in heart in childhood   Elevated blood pressure reading  -monitor BP  Reduce salt   HM Flu shot had 10/16/19  Tdap per pt had 03/2017 with green card   Former smoker quit 2009  smoking > 1ppd x 6 years   Eye Dr. Lew Dawesitter Dentist Dr. Karilyn CotaYing Wang  Fasting labs upcoming   Provider: Dr. French Anaracy McLean-Scocuzza-[facility] Medicine

## 2019-10-22 ENCOUNTER — Other Ambulatory Visit: Payer: Self-pay

## 2019-10-22 ENCOUNTER — Ambulatory Visit (INDEPENDENT_AMBULATORY_CARE_PROVIDER_SITE_OTHER): Payer: 59 | Admitting: Cardiology

## 2019-10-22 ENCOUNTER — Encounter: Payer: Self-pay | Admitting: Cardiology

## 2019-10-22 ENCOUNTER — Encounter: Payer: Self-pay | Admitting: Internal Medicine

## 2019-10-22 VITALS — BP 138/88 | HR 57 | Ht 69.0 in | Wt 180.0 lb

## 2019-10-22 DIAGNOSIS — Q249 Congenital malformation of heart, unspecified: Secondary | ICD-10-CM

## 2019-10-22 DIAGNOSIS — R079 Chest pain, unspecified: Secondary | ICD-10-CM

## 2019-10-22 NOTE — Patient Instructions (Signed)
Medication Instructions:  Your physician recommends that you continue on your current medications as directed. Please refer to the Current Medication list given to you today.  *If you need a refill on your cardiac medications before your next appointment, please call your pharmacy*  Lab Work: None ordered If you have labs (blood work) drawn today and your tests are completely normal, you will receive your results only by: Marland Kitchen MyChart Message (if you have MyChart) OR . A paper copy in the mail If you have any lab test that is abnormal or we need to change your treatment, we will call you to review the results.  Testing/Procedures: Your physician has requested that you have an echocardiogram. Echocardiography is a painless test that uses sound waves to create images of your heart. It provides your doctor with information about the size and shape of your heart and how well your heart's chambers and valves are working. This procedure takes approximately one hour. There are no restrictions for this procedure.    Follow-Up: At [facility], you and your health needs are our priority.  As part of our continuing mission to provide you with exceptional heart care, we have created designated Provider Care Teams.  These Care Teams include your primary Cardiologist (physician) and Advanced Practice Providers (APPs -  Physician Assistants and Nurse Practitioners) who all work together to provide you with the care you need, when you need it.  Your next appointment:   4-6 weeks  The format for your next appointment:   In Person  Provider:    You may see Kate Sable, MD or one of the following Advanced Practice Providers on your designated Care Team:    Murray Hodgkins, NP  Christell Faith, PA-C  Marrianne Mood, PA-C   Other Instructions N/A

## 2019-10-22 NOTE — Progress Notes (Signed)
Cardiology Office Note:    Date:  10/22/2019   ID:  Sylvain Hasten, DOB 06-10-1986, MRN [id number]  PCP:  McLean-Scocuzza, Nino Glow, MD  Cardiologist:  Kate Sable, MD  Electrophysiologist:  None   Referring MD: McLean-Scocuzza, Olivia Mackie *   Chief Complaint  Patient presents with  . New Patient (Initial Visit)    Referred by PCP for chest pain. patient c/io chest pain and dizziness only in the morning. Meds reviewed verbally with patient.    Roshard Rezabek is a 33 y.o. male who is being seen today for the evaluation of chest pain at the request of McLean-Scocuzza, Olivia Mackie *MD   History of Present Illness:    Wren Gallaga is a 33 y.o. male with no significant past medical history who presents due to chest pain.  Patient states having chest pain for about 3 weeks now.  Pain is not related with exertion, is located on the left side of his chest, rates it as a 3 out of 10, feels like muscle soreness.  He rides his mountain bike for about an hour 3 times a week without any symptoms of chest pain or shortness of breath.  He had one episode of running last week where he felt some chest tightness for couple of minutes but he continued running with resolution.  He states having a hole in his heart that was diagnosed when he was 33 years old.  He denies smoking.  Past Medical History:  Diagnosis Date  . Kidney stones     Past Surgical History:  Procedure Laterality Date  . EXTRACORPOREAL SHOCK WAVE LITHOTRIPSY Left 07/05/2019   Procedure: EXTRACORPOREAL SHOCK WAVE LITHOTRIPSY (ESWL);  Surgeon: Royston Cowper, MD;  Location: [facility] ORS;  Service: Urology;  Laterality: Left;    Current Medications: No outpatient medications have been marked as taking for the 10/22/19 encounter (Office Visit) with Kate Sable, MD.     Allergies:   Patient has no known allergies.   Social History   Socioeconomic History  . Marital status: Married    Spouse name: Not on file  . Number of children: Not  on file  . Years of education: Not on file  . Highest education level: Not on file  Occupational History  . Not on file  Social Needs  . Financial resource strain: Not on file  . Food insecurity    Worry: Not on file    Inability: Not on file  . Transportation needs    Medical: Not on file    Non-medical: Not on file  Tobacco Use  . Smoking status: Former Smoker    Quit date: 06/24/2008    Years since quitting: 11.3  . Smokeless tobacco: Never Used  . Tobacco comment: smoker x 6 years >1 ppd and quit in 2009  Substance and Sexual Activity  . Alcohol use: Yes    Alcohol/week: 5.0 standard drinks    Types: 5 Cans of beer per week  . Drug use: Never  . Sexual activity: Yes  Lifestyle  . Physical activity    Days per week: Not on file    Minutes per session: Not on file  . Stress: Not on file  Relationships  . Social Herbalist on phone: Not on file    Gets together: Not on file    Attends religious service: Not on file    Active member of club or organization: Not on file    Attends meetings of clubs or organizations: Not  on file    Relationship status: Not on file  Other Topics Concern  . Not on file  Social History Narrative   Married    2 kids boy 77 y.o and 65 month old baby as of 10/17/19   From [place] lived in [place] since 2013 has lived in [place] in [place]    Bachelors degree Public affairs consultant         Family History: The patient's family history includes Diabetes in his father.  ROS:   Please see the history of present illness.     All other systems reviewed and are negative.  EKGs/Labs/Other Studies Reviewed:    The following studies were reviewed today:   EKG:  EKG is  ordered today.  The ekg ordered today demonstrates sinus bradycardia, heart rate 57, incomplete right bundle branch block.  Recent Labs: No results found for requested labs within last 8760 hours.  Recent Lipid Panel No results found for: CHOL, TRIG, HDL, CHOLHDL, VLDL,  LDLCALC, LDLDIRECT  Physical Exam:    VS:  BP 138/88 (BP Location: Right Arm, Patient Position: Sitting, Cuff Size: Normal)   Pulse (!) 57   Ht 5\' 9"  (1.753 m)   Wt 180 lb (81.6 kg)   BMI 26.58 kg/m     Wt Readings from Last 3 Encounters:  10/22/19 180 lb (81.6 kg)  10/17/19 178 lb 12.8 oz (81.1 kg)  06/25/19 175 lb (79.4 kg)     GEN:  Well nourished, well developed in no acute distress HEENT: Normal NECK: No JVD; No carotid bruits LYMPHATICS: No lymphadenopathy CARDIAC: RRR, no murmurs, rubs, gallops RESPIRATORY:  Clear to auscultation without rales, wheezing or rhonchi  ABDOMEN: Soft, non-tender, non-distended MUSCULOSKELETAL:  No edema; No deformity  SKIN: Warm and dry NEUROLOGIC:  Alert and oriented x 3 PSYCHIATRIC:  Normal affect   ASSESSMENT:   Patient with atypical chest pain.  He has no risk factors for CAD.  Etiology for chest pain not likely cardiac origin.  No murmurs auscultated on physical exam.  His heart defect was likely ASD versus VSD. 1. Chest pain, unspecified type   2. Congenital heart defect    PLAN:    In order of problems listed above:  1. Not likely of cardiac origin.  Patient will monitor symptoms and let 08/26/19 know if he gets worse.  At that point we may consider stress test but again I do not think his chest pain is of cardiac origin. 2. We will get an echocardiogram to evaluate possible septal defects.  Total encounter time more than 60 minutes  Greater than 50% was spent in counseling and coordination of care with the patient  This note was generated in part or whole with voice recognition software. Voice recognition is usually quite accurate but there are transcription errors that can and very often do occur. I apologize for any typographical errors that were not detected and corrected.  Medication Adjustments/Labs and Tests Ordered: Current medicines are reviewed at length with the patient today.  Concerns regarding medicines are outlined  above.  Orders Placed This Encounter  Procedures  . EKG 12-Lead  . ECHOCARDIOGRAM COMPLETE   No orders of the defined types were placed in this encounter.   Patient Instructions  Medication Instructions:  Your physician recommends that you continue on your current medications as directed. Please refer to the Current Medication list given to you today.  *If you need a refill on your cardiac medications before your next appointment, please call your  pharmacy*  Lab Work: None ordered If you have labs (blood work) drawn today and your tests are completely normal, you will receive your results only by: Marland Kitchen. MyChart Message (if you have MyChart) OR . A paper copy in the mail If you have any lab test that is abnormal or we need to change your treatment, we will call you to review the results.  Testing/Procedures: Your physician has requested that you have an echocardiogram. Echocardiography is a painless test that uses sound waves to create images of your heart. It provides your doctor with information about the size and shape of your heart and how well your heart's chambers and valves are working. This procedure takes approximately one hour. There are no restrictions for this procedure.    Follow-Up: At [facility], you and your health needs are our priority.  As part of our continuing mission to provide you with exceptional heart care, we have created designated Provider Care Teams.  These Care Teams include your primary Cardiologist (physician) and Advanced Practice Providers (APPs -  Physician Assistants and Nurse Practitioners) who all work together to provide you with the care you need, when you need it.  Your next appointment:   4-6 weeks  The format for your next appointment:   In Person  Provider:    You may see Debbe OdeaBrian Agbor-Etang, MD or one of the following Advanced Practice Providers on your designated Care Team:    Nicolasa Duckinghristopher Berge, NP  Eula Listenyan Dunn, PA-C  Marisue IvanJacquelyn  Visser, PA-C   Other Instructions N/A     Signed, Debbe OdeaBrian Agbor-Etang, MD  10/22/2019 4:51 PM    [facility]

## 2019-10-24 ENCOUNTER — Encounter: Payer: Self-pay | Admitting: Internal Medicine

## 2019-10-25 ENCOUNTER — Other Ambulatory Visit: Payer: Self-pay

## 2019-10-25 ENCOUNTER — Other Ambulatory Visit (INDEPENDENT_AMBULATORY_CARE_PROVIDER_SITE_OTHER): Payer: 59

## 2019-10-25 DIAGNOSIS — R42 Dizziness and giddiness: Secondary | ICD-10-CM

## 2019-10-25 DIAGNOSIS — Z13220 Encounter for screening for lipoid disorders: Secondary | ICD-10-CM

## 2019-10-25 DIAGNOSIS — Z1329 Encounter for screening for other suspected endocrine disorder: Secondary | ICD-10-CM | POA: Diagnosis not present

## 2019-10-25 DIAGNOSIS — Z0000 Encounter for general adult medical examination without abnormal findings: Secondary | ICD-10-CM | POA: Diagnosis not present

## 2019-10-25 DIAGNOSIS — E559 Vitamin D deficiency, unspecified: Secondary | ICD-10-CM | POA: Diagnosis not present

## 2019-10-25 DIAGNOSIS — Z1389 Encounter for screening for other disorder: Secondary | ICD-10-CM

## 2019-10-25 LAB — LIPID PANEL
Cholesterol: 161 mg/dL (ref 0–200)
HDL: 37.6 mg/dL — ABNORMAL LOW (ref >39.00)
LDL Cholesterol: 107 mg/dL — ABNORMAL HIGH (ref 0–99)
NonHDL: 122.91
Total CHOL/HDL Ratio: 4
Triglycerides: 81 mg/dL (ref 0.0–149.0)
VLDL: 16.2 mg/dL (ref 0.0–40.0)

## 2019-10-25 LAB — COMPREHENSIVE METABOLIC PANEL
ALT: 27 U/L (ref 0–53)
AST: 20 U/L (ref 0–37)
Albumin: 4.6 g/dL (ref 3.5–5.2)
Alkaline Phosphatase: 63 U/L (ref 39–117)
BUN: 16 mg/dL (ref 6–23)
CO2: 31 mEq/L (ref 19–32)
Calcium: 9.9 mg/dL (ref 8.4–10.5)
Chloride: 101 mEq/L (ref 96–112)
Creatinine, Ser: 0.92 mg/dL (ref 0.40–1.50)
GFR: 94 mL/min (ref >60.00)
Glucose, Bld: 92 mg/dL (ref 70–99)
Potassium: 4.4 mEq/L (ref 3.5–5.1)
Sodium: 138 mEq/L (ref 135–145)
Total Bilirubin: 0.6 mg/dL (ref 0.2–1.2)
Total Protein: 7.9 g/dL (ref 6.0–8.3)

## 2019-10-25 LAB — CBC WITH DIFFERENTIAL/PLATELET
Basophils Absolute: 0 10*3/uL (ref 0.0–0.1)
Basophils Relative: 0.5 % (ref 0.0–3.0)
Eosinophils Absolute: 0.2 10*3/uL (ref 0.0–0.7)
Eosinophils Relative: 2.2 % (ref 0.0–5.0)
HCT: 46.7 % (ref 39.0–52.0)
Hemoglobin: 15.3 g/dL (ref 13.0–17.0)
Lymphocytes Relative: 27.7 % (ref 12.0–46.0)
Lymphs Abs: 2.5 10*3/uL (ref 0.7–4.0)
MCHC: 32.9 g/dL (ref 30.0–36.0)
MCV: 89.6 fl (ref 78.0–100.0)
Monocytes Absolute: 0.6 10*3/uL (ref 0.1–1.0)
Monocytes Relative: 6.6 % (ref 3.0–12.0)
Neutro Abs: 5.6 10*3/uL (ref 1.4–7.7)
Neutrophils Relative %: 63 % (ref 43.0–77.0)
Platelets: 223 10*3/uL (ref 150.0–400.0)
RBC: 5.21 Mil/uL (ref 4.22–5.81)
RDW: 12.6 % (ref 11.5–15.5)
WBC: 9 10*3/uL (ref 4.0–10.5)

## 2019-10-25 LAB — VITAMIN D 25 HYDROXY (VIT D DEFICIENCY, FRACTURES): VITD: 34.15 ng/mL (ref 30.00–100.00)

## 2019-10-25 LAB — TSH: TSH: 1.7 u[IU]/mL (ref 0.35–4.50)

## 2019-10-26 ENCOUNTER — Encounter: Payer: Self-pay | Admitting: Internal Medicine

## 2019-10-26 LAB — URINALYSIS, ROUTINE W REFLEX MICROSCOPIC
Bilirubin, UA: NEGATIVE
Glucose, UA: NEGATIVE
Ketones, UA: NEGATIVE
Leukocytes,UA: NEGATIVE
Nitrite, UA: NEGATIVE
Protein,UA: NEGATIVE
RBC, UA: NEGATIVE
Specific Gravity, UA: 1.021 (ref 1.005–1.030)
Urobilinogen, Ur: 0.2 mg/dL (ref 0.2–1.0)
pH, UA: 6.5 (ref 5.0–7.5)

## 2019-10-29 ENCOUNTER — Other Ambulatory Visit: Payer: Self-pay | Admitting: Internal Medicine

## 2019-10-29 DIAGNOSIS — H814 Vertigo of central origin: Secondary | ICD-10-CM

## 2019-10-29 DIAGNOSIS — R42 Dizziness and giddiness: Secondary | ICD-10-CM

## 2019-10-29 DIAGNOSIS — H547 Unspecified visual loss: Secondary | ICD-10-CM

## 2019-11-01 ENCOUNTER — Ambulatory Visit (INDEPENDENT_AMBULATORY_CARE_PROVIDER_SITE_OTHER): Payer: 59

## 2019-11-01 ENCOUNTER — Other Ambulatory Visit: Payer: Self-pay

## 2019-11-01 DIAGNOSIS — Q249 Congenital malformation of heart, unspecified: Secondary | ICD-10-CM

## 2019-11-01 DIAGNOSIS — R079 Chest pain, unspecified: Secondary | ICD-10-CM | POA: Diagnosis not present

## 2019-11-03 ENCOUNTER — Ambulatory Visit
Admission: RE | Admit: 2019-11-03 | Discharge: 2019-11-03 | Disposition: A | Discharge status: Home / Self Care | Payer: 59 | Source: Ambulatory Visit | Attending: Internal Medicine | Admitting: Internal Medicine

## 2019-11-03 ENCOUNTER — Other Ambulatory Visit: Payer: Self-pay

## 2019-11-03 DIAGNOSIS — H547 Unspecified visual loss: Secondary | ICD-10-CM

## 2019-11-03 DIAGNOSIS — H814 Vertigo of central origin: Secondary | ICD-10-CM

## 2019-11-03 DIAGNOSIS — R42 Dizziness and giddiness: Secondary | ICD-10-CM

## 2019-11-03 MED ORDER — GADOBENATE DIMEGLUMINE 529 MG/ML IV SOLN
15.0000 mL | Freq: Once | INTRAVENOUS | Status: AC | PRN
  Administered 2019-11-03: 10:00:00 15 mL via INTRAVENOUS

## 2019-11-05 ENCOUNTER — Encounter: Payer: Self-pay | Admitting: Internal Medicine

## 2019-11-07 ENCOUNTER — Other Ambulatory Visit: Payer: Self-pay

## 2019-11-09 ENCOUNTER — Other Ambulatory Visit: Payer: Self-pay

## 2019-11-09 ENCOUNTER — Ambulatory Visit (INDEPENDENT_AMBULATORY_CARE_PROVIDER_SITE_OTHER): Payer: 59 | Admitting: Internal Medicine

## 2019-11-09 ENCOUNTER — Encounter: Payer: Self-pay | Admitting: Internal Medicine

## 2019-11-09 VITALS — Ht 69.0 in | Wt 176.0 lb

## 2019-11-09 DIAGNOSIS — Z0184 Encounter for antibody response examination: Secondary | ICD-10-CM

## 2019-11-09 DIAGNOSIS — Z1159 Encounter for screening for other viral diseases: Secondary | ICD-10-CM

## 2019-11-09 DIAGNOSIS — Z1389 Encounter for screening for other disorder: Secondary | ICD-10-CM | POA: Diagnosis not present

## 2019-11-09 DIAGNOSIS — E785 Hyperlipidemia, unspecified: Secondary | ICD-10-CM | POA: Insufficient documentation

## 2019-11-09 DIAGNOSIS — Z0000 Encounter for general adult medical examination without abnormal findings: Secondary | ICD-10-CM | POA: Diagnosis not present

## 2019-11-09 DIAGNOSIS — Z1329 Encounter for screening for other suspected endocrine disorder: Secondary | ICD-10-CM | POA: Diagnosis not present

## 2019-11-09 DIAGNOSIS — Z13220 Encounter for screening for lipoid disorders: Secondary | ICD-10-CM

## 2019-11-09 NOTE — Progress Notes (Signed)
Virtual Visit via Video Note  I connected with Louis Bird  on 11/09/19 at 10:00 AM EST by a video enabled telemedicine application and verified that I am speaking with the correct person using two identifiers.  Location patient: home Location provider:work or home office Persons participating in the virtual visit: patient, provider  I discussed the limitations of evaluation and management by telemedicine and the availability of in person appointments. The patient expressed understanding and agreed to proceed.   HPI: 1. Annual doing well chest pain and dizziness resolved  W/u negative cardiology echo and MRI brain he had a lot of stress with buying new house, job stress daughter is 19 months old wife issues btu now all better and he is working out 4x per week 45 minutes and it is helping   BP at home 125/76-76 119/72    ROS: See pertinent positives and negatives per HPI. General: wt stable HEENT: no sore throat CV: no chest pain  Lungs: no sob  GI: no ab pain  GU: no issues Neuro: dizziness resolved  Psych: stress  Skin: no issues MSK: no issues  Past Medical History:  Diagnosis Date  . Kidney stones     Past Surgical History:  Procedure Laterality Date  . EXTRACORPOREAL SHOCK WAVE LITHOTRIPSY Left 07/05/2019   Procedure: EXTRACORPOREAL SHOCK WAVE LITHOTRIPSY (ESWL);  Surgeon: Royston Cowper, MD;  Location: [facility] ORS;  Service: Urology;  Laterality: Left;    Family History  Problem Relation Age of Onset  . Diabetes Father     SOCIAL HX: married with kids   No current outpatient medications on file.  EXAM:  VITALS per patient if applicable:  GENERAL: alert, oriented, appears well and in no acute distress  HEENT: atraumatic, conjunttiva clear, no obvious abnormalities on inspection of external nose and ears  NECK: normal movements of the head and neck  LUNGS: on inspection no signs of respiratory distress, breathing rate appears normal, no obvious gross SOB, gasping  or wheezing  CV: no obvious cyanosis  MS: moves all visible extremities without noticeable abnormality  PSYCH/NEURO: pleasant and cooperative, no obvious depression or anxiety, speech and thought processing grossly intact  ASSESSMENT AND PLAN:  Discussed the following assessment and plan:  Annual physical exam Flu shot had 10/16/19  Tdap per pt had 03/2017 with green card   Former smoker quit 2009 smoking > 1ppd x 6 years  rec healthy diet and exercise control stress  Eye Dr. Kerin Ransom Dentist Dr. Marc Morgans  Fasting labs upcoming 10/24/20 with physical  F/u leb cards 11/2019 and prn chest pain returns  Monitor BP   -we discussed possible serious and likely etiologies, options for evaluation and workup, limitations of telemedicine visit vs in person visit, treatment, treatment risks and precautions. Pt prefers to treat via telemedicine empirically rather then risking or undertaking an in person visit at this moment. Patient agrees to seek prompt in person care if worsening, new symptoms arise, or if is not improving with treatment.   I discussed the assessment and treatment plan with the patient. The patient was provided an opportunity to ask questions and all were answered. The patient agreed with the plan and demonstrated an understanding of the instructions.   The patient was advised to call back or seek an in-person evaluation if the symptoms worsen or if the condition fails to improve as anticipated.   Nino Glow McLean-Scocuzza, MD

## 2019-11-09 NOTE — Patient Instructions (Signed)

## 2019-12-04 ENCOUNTER — Ambulatory Visit (INDEPENDENT_AMBULATORY_CARE_PROVIDER_SITE_OTHER): Payer: 59 | Admitting: Cardiology

## 2019-12-04 ENCOUNTER — Other Ambulatory Visit: Payer: Self-pay

## 2019-12-04 ENCOUNTER — Encounter: Payer: Self-pay | Admitting: Cardiology

## 2019-12-04 VITALS — BP 118/66 | HR 54 | Temp 96.8°F | Ht 69.0 in | Wt 180.0 lb

## 2019-12-04 DIAGNOSIS — Q231 Congenital insufficiency of aortic valve: Secondary | ICD-10-CM | POA: Diagnosis not present

## 2019-12-04 NOTE — Progress Notes (Signed)
Cardiology Office Note:    Date:  12/04/2019   ID:  Louis Bird, DOB 1985/12/24, MRN [id number]  PCP:  McLean-Scocuzza, Nino Glow, MD  Cardiologist:  Kate Sable, MD  Electrophysiologist:  None   Referring MD: McLean-Scocuzza, Olivia Mackie *   Chief Complaint  Patient presents with  . other    Follow up Echo. Meds reviewed by the pt. verbally. "I feel great!"     History of Present Illness:    Louis Bird is a 32 y.o. male with no significant past medical history who presents for follow-up.  She was originally seen due to chest pain x3 weeks.  Pain was atypical and not related to exertion.  Able to ride his mountain bike for about 3 hours without any symptoms.  Also stated having a hole in his heart diagnosed when he was 33 years old.  Echocardiogram was ordered to evaluate for any defects.  Patient presents for results.   Past Medical History:  Diagnosis Date  . Kidney stones     Past Surgical History:  Procedure Laterality Date  . EXTRACORPOREAL SHOCK WAVE LITHOTRIPSY Left 07/05/2019   Procedure: EXTRACORPOREAL SHOCK WAVE LITHOTRIPSY (ESWL);  Surgeon: Royston Cowper, MD;  Location: [facility] ORS;  Service: Urology;  Laterality: Left;    Current Medications: No outpatient medications have been marked as taking for the 12/04/19 encounter (Office Visit) with Kate Sable, MD.     Allergies:   Patient has no known allergies.   Social History   Socioeconomic History  . Marital status: Married    Spouse name: Not on file  . Number of children: Not on file  . Years of education: Not on file  . Highest education level: Not on file  Occupational History  . Not on file  Tobacco Use  . Smoking status: Former Smoker    Quit date: 06/24/2008    Years since quitting: 11.4  . Smokeless tobacco: Never Used  . Tobacco comment: smoker x 6 years >1 ppd and quit in 2009  Substance and Sexual Activity  . Alcohol use: Yes    Alcohol/week: 5.0 standard drinks    Types: 5 Cans of  beer per week  . Drug use: Never  . Sexual activity: Yes  Other Topics Concern  . Not on file  Social History Narrative   Married    2 kids boy 32 y.o and 55 month old baby as of 10/17/19   From [place] lived in [place] since 2013 has lived in [place] in 2013    Bachelors degree Recruitment consultant       Social Determinants of Health   Financial Resource Strain:   . Difficulty of Paying Living Expenses: Not on file  Food Insecurity:   . Worried About Charity fundraiser in the Last Year: Not on file  . Ran Out of Food in the Last Year: Not on file  Transportation Needs:   . Lack of Transportation (Medical): Not on file  . Lack of Transportation (Non-Medical): Not on file  Physical Activity:   . Days of Exercise per Week: Not on file  . Minutes of Exercise per Session: Not on file  Stress:   . Feeling of Stress : Not on file  Social Connections:   . Frequency of Communication with Friends and Family: Not on file  . Frequency of Social Gatherings with Friends and Family: Not on file  . Attends Religious Services: Not on file  . Active Member of Clubs or Organizations: Not  on file  . Attends Banker Meetings: Not on file  . Marital Status: Not on file     Family History: The patient's family history includes Diabetes in his father.  ROS:   Please see the history of present illness.     All other systems reviewed and are negative.  EKGs/Labs/Other Studies Reviewed:    The following studies were reviewed today: TTE 11/12/19   1. Left ventricular ejection fraction, by visual estimation, is 60 to 65%. The left ventricle has normal function. There is no left ventricular hypertrophy.  2. Global right ventricle has normal systolic function.The right ventricular size is normal. No increase in right ventricular wall thickness.  3. Left atrial size was normal.  4. The aortic valve was not well visualized. Possible bicuspid aortic valve. Aortic valve regurgitation is  mild.  5. TR signal is inadequate for assessing pulmonary artery systolic pressure.  EKG:  EKG is  ordered today.  The ekg ordered today demonstrates sinus bradycardia, heart rate 57, incomplete right bundle branch block.  Recent Labs: 10/25/2019: ALT 27; BUN 16; Creatinine, Ser 0.92; Hemoglobin 15.3; Platelets 223.0; Potassium 4.4; Sodium 138; TSH 1.70  Recent Lipid Panel    Component Value Date/Time   CHOL 161 10/25/2019 0904   TRIG 81.0 10/25/2019 0904   HDL 37.60 (L) 10/25/2019 0904   CHOLHDL 4 10/25/2019 0904   VLDL 16.2 10/25/2019 0904   LDLCALC 107 (H) 10/25/2019 0904    Physical Exam:    VS:  BP 118/66 (BP Location: Left Arm, Patient Position: Sitting, Cuff Size: Normal)   Pulse (!) 54   Temp (!) 96.8 F (36 C)   Ht 5\' 9"  (1.753 m)   Wt 180 lb (81.6 kg)   SpO2 99%   BMI 26.58 kg/m     Wt Readings from Last 3 Encounters:  12/04/19 180 lb (81.6 kg)  11/09/19 176 lb (79.8 kg)  10/22/19 180 lb (81.6 kg)     GEN:  Well nourished, well developed in no acute distress HEENT: Normal NECK: No JVD; No carotid bruits LYMPHATICS: No lymphadenopathy CARDIAC: RRR, no murmurs, rubs, gallops RESPIRATORY:  Clear to auscultation without rales, wheezing or rhonchi  ABDOMEN: Soft, non-tender, non-distended MUSCULOSKELETAL:  No edema; No deformity  SKIN: Warm and dry NEUROLOGIC:  Alert and oriented x 3 PSYCHIATRIC:  Normal affect   ASSESSMENT:   Patient with atypical chest pain.  He has no risk factors for CAD.  Etiology for chest pain not likely cardiac origin.  Patient states having a defect in his heart.  Transthoracic echo showed a bicuspid aortic valve, mild AI, with no evidence for atrial or ventricular septal defect.  Systolic and diastolic function were normal. 1. Bicuspid aortic valve    PLAN:    In order of problems listed above:  1. Echocardiogram showed bicuspid aortic valve, normal aortic root.  No atrial or ventricular septal defect noted.  Follow-up in 1  year.  Get echocardiogram prior to follow-up.  Total encounter time more than 35 minutes  Greater than 50% was spent in counseling and coordination of care with the patient  This note was generated in part or whole with voice recognition software. Voice recognition is usually quite accurate but there are transcription errors that can and very often do occur. I apologize for any typographical errors that were not detected and corrected.  Medication Adjustments/Labs and Tests Ordered: Current medicines are reviewed at length with the patient today.  Concerns regarding medicines are outlined  above.  Orders Placed This Encounter  Procedures  . ECHOCARDIOGRAM COMPLETE   No orders of the defined types were placed in this encounter.   Patient Instructions  Medication Instructions:  Your physician recommends that you continue on your current medications as directed. Please refer to the Current Medication list given to you today.  *If you need a refill on your cardiac medications before your next appointment, please call your pharmacy*  Lab Work: none If you have labs (blood work) drawn today and your tests are completely normal, you will receive your results only by: Marland Kitchen. MyChart Message (if you have MyChart) OR . A paper copy in the mail If you have any lab test that is abnormal or we need to change your treatment, we will call you to review the results.  Testing/Procedures: Your physician has requested that you have an echocardiogram in 1 year prior to follow up appointment. Echocardiography is a painless test that uses sound waves to create images of your heart. It provides your doctor with information about the size and shape of your heart and how well your heart's chambers and valves are working. This procedure takes approximately one hour. There are no restrictions for this procedure. You may get an IV, if needed, to receive an ultrasound enhancing agent through to better visualize your  heart.    Follow-Up: At [facility], you and your health needs are our priority.  As part of our continuing mission to provide you with exceptional heart care, we have created designated Provider Care Teams.  These Care Teams include your primary Cardiologist (physician) and Advanced Practice Providers (APPs -  Physician Assistants and Nurse Practitioners) who all work together to provide you with the care you need, when you need it.  Your next appointment:   12 month(s)  - Please have the echo within a week or so prior to your 1 year follow up appointment. You can schedule both when you call next year.   The format for your next appointment:   In Person  Provider:    You may see Debbe OdeaBrian Agbor-Etang, MD or one of the following Advanced Practice Providers on your designated Care Team:    Nicolasa Duckinghristopher Berge, NP  Eula Listenyan Dunn, PA-C  Marisue IvanJacquelyn Visser, PA-C     Signed, Debbe OdeaBrian Agbor-Etang, MD  12/04/2019 10:00 AM    [facility]

## 2019-12-04 NOTE — Patient Instructions (Signed)
Medication Instructions:  Your physician recommends that you continue on your current medications as directed. Please refer to the Current Medication list given to you today.  *If you need a refill on your cardiac medications before your next appointment, please call your pharmacy*  Lab Work: none If you have labs (blood work) drawn today and your tests are completely normal, you will receive your results only by: Marland Kitchen MyChart Message (if you have MyChart) OR . A paper copy in the mail If you have any lab test that is abnormal or we need to change your treatment, we will call you to review the results.  Testing/Procedures: Your physician has requested that you have an echocardiogram in 1 year prior to follow up appointment. Echocardiography is a painless test that uses sound waves to create images of your heart. It provides your doctor with information about the size and shape of your heart and how well your heart's chambers and valves are working. This procedure takes approximately one hour. There are no restrictions for this procedure. You may get an IV, if needed, to receive an ultrasound enhancing agent through to better visualize your heart.    Follow-Up: At [facility], you and your health needs are our priority.  As part of our continuing mission to provide you with exceptional heart care, we have created designated Provider Care Teams.  These Care Teams include your primary Cardiologist (physician) and Advanced Practice Providers (APPs -  Physician Assistants and Nurse Practitioners) who all work together to provide you with the care you need, when you need it.  Your next appointment:   12 month(s)  - Please have the echo within a week or so prior to your 1 year follow up appointment. You can schedule both when you call next year.   The format for your next appointment:   In Person  Provider:    You may see Kate Sable, MD or one of the following Advanced Practice Providers  on your designated Care Team:    Murray Hodgkins, NP  Christell Faith, PA-C  Marrianne Mood, PA-C

## 2019-12-18 ENCOUNTER — Ambulatory Visit: Payer: 59 | Admitting: Internal Medicine

## 2020-01-05 IMAGING — CT CT ABDOMEN AND PELVIS WITHOUT AND WITH CONTRAST
2 of 8 series · 12 of 46 positions shown, 18 images · IV contrast (omnipaque)
Comparison: None.

CLINICAL DATA: Gross hematuria, right abdominal pain

EXAM:
CT ABDOMEN AND PELVIS WITHOUT AND WITH CONTRAST
TECHNIQUE: Multidetector CT imaging of the abdomen and pelvis was performed
following the standard protocol before and following the bolus
administration of intravenous contrast.
CONTRAST:  150mL OMNIPAQUE IOHEXOL 300 MG/ML  SOLN

[Series 2: axial without pre · axial · non-contrast · 0.77mm/px · z∈[-1479,-1074]mm · 9 of 101 slices shown, 15 images]
[im 10/101  soft-tissue]
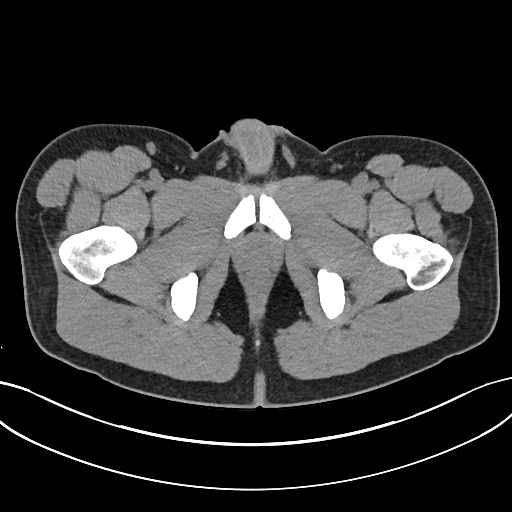
[im 10/101  bone]
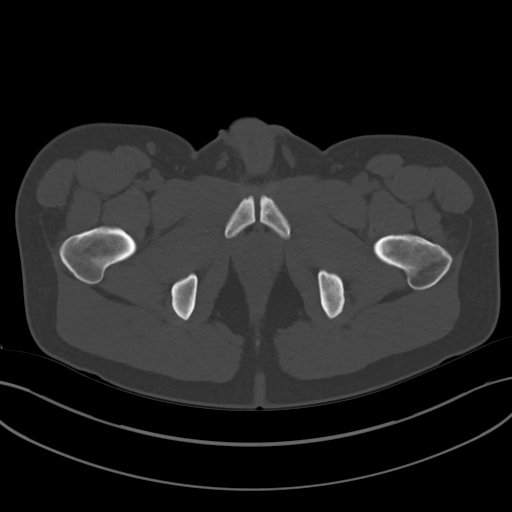
[im 19/101  soft-tissue]
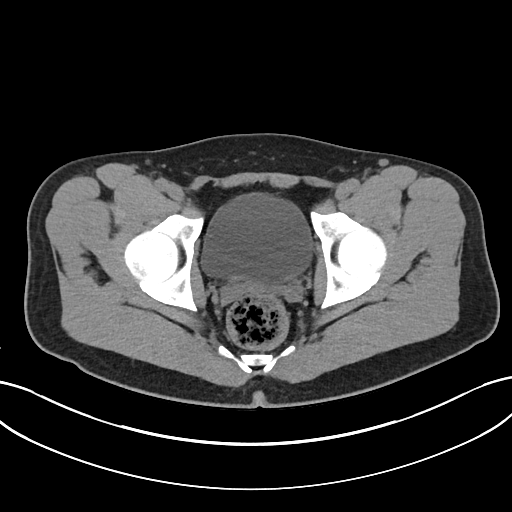
[im 28/101  soft-tissue]
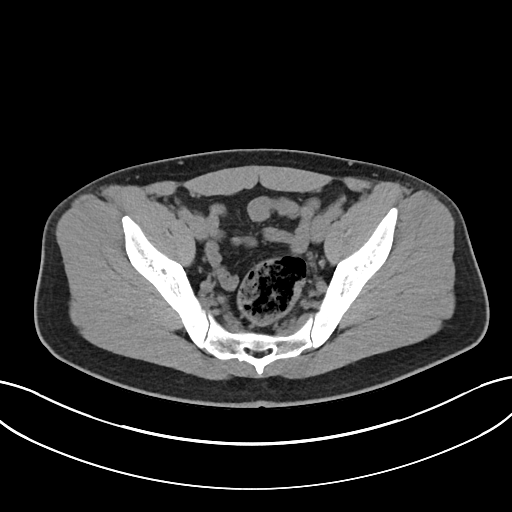
[im 37/101  soft-tissue]
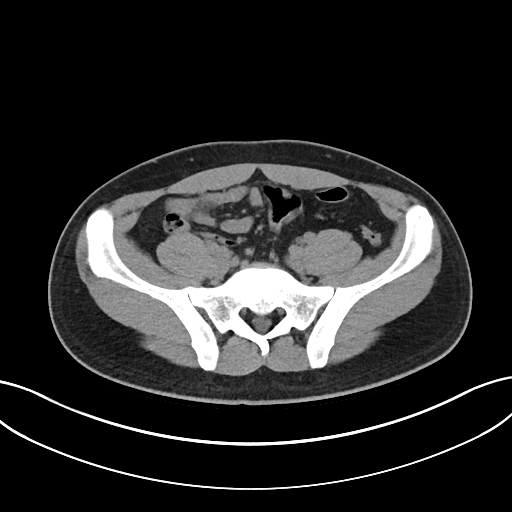
[im 55/101  soft-tissue]
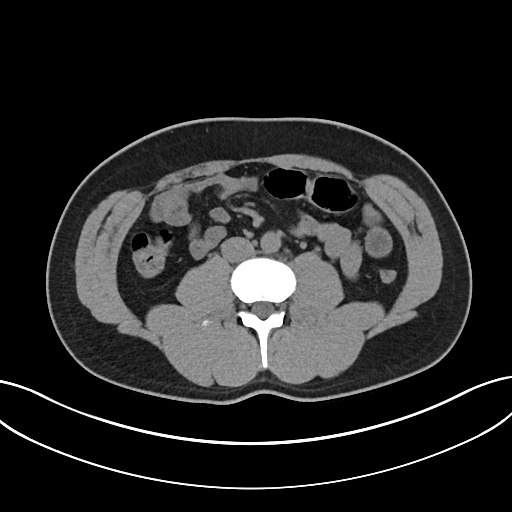
[im 64/101  soft-tissue]
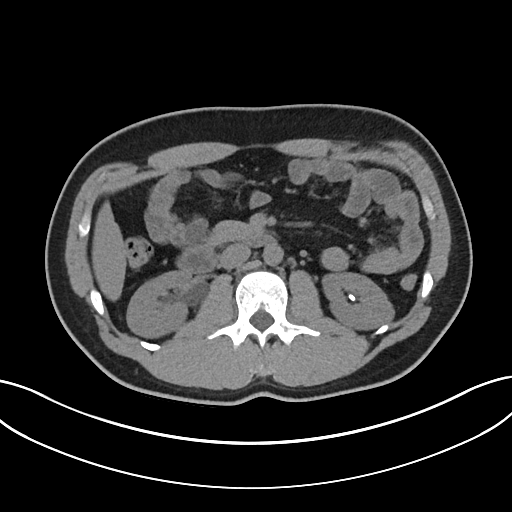
[im 64/101  lung]
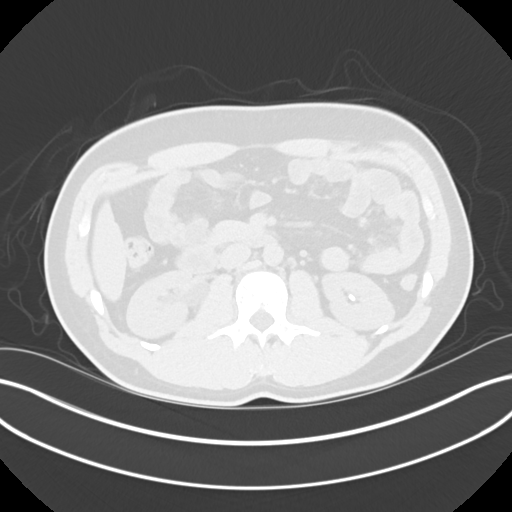
[im 73/101  soft-tissue]
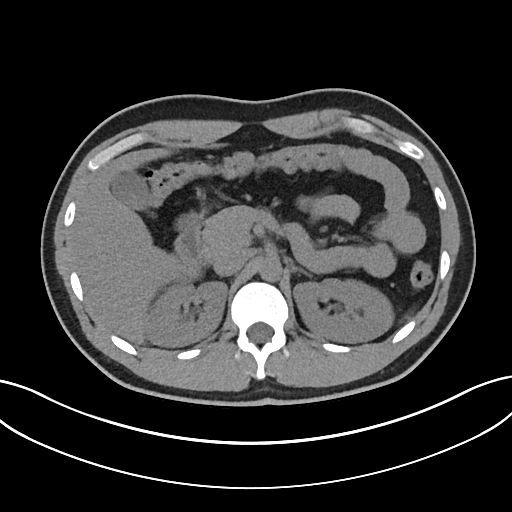
[im 73/101  lung]
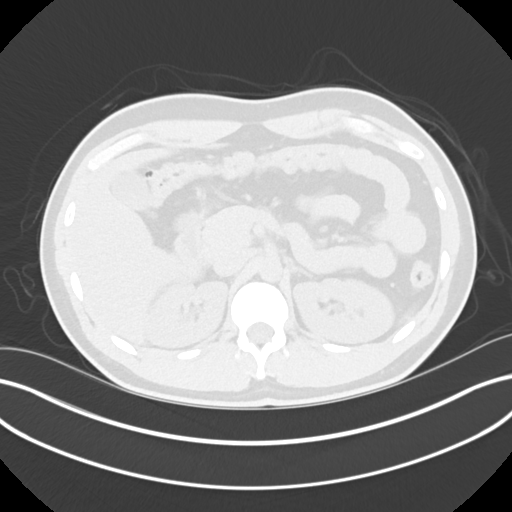
[im 82/101  soft-tissue]
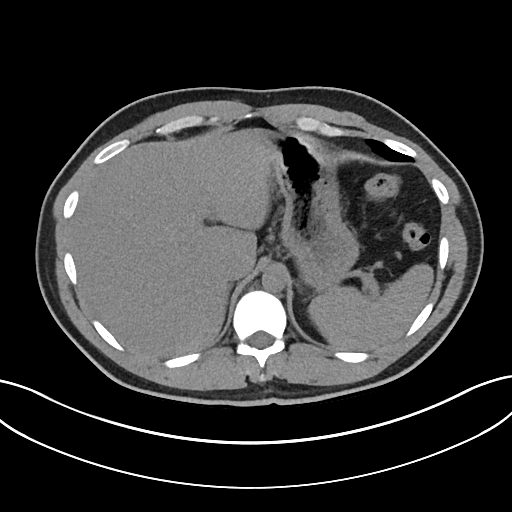
[im 82/101  lung]
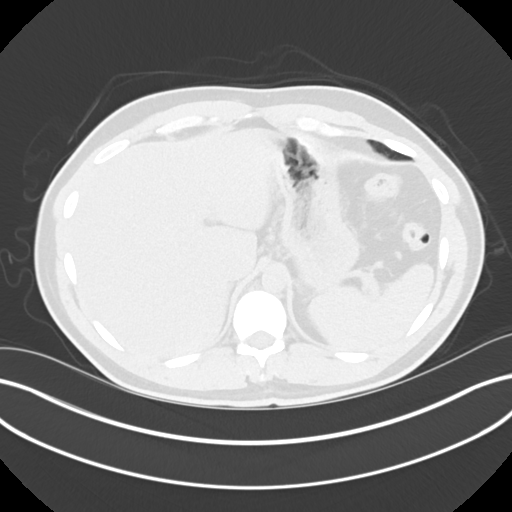
[im 91/101  soft-tissue]
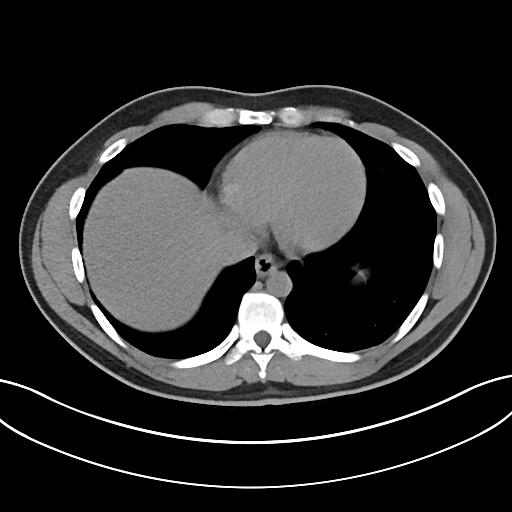
[im 91/101  lung]
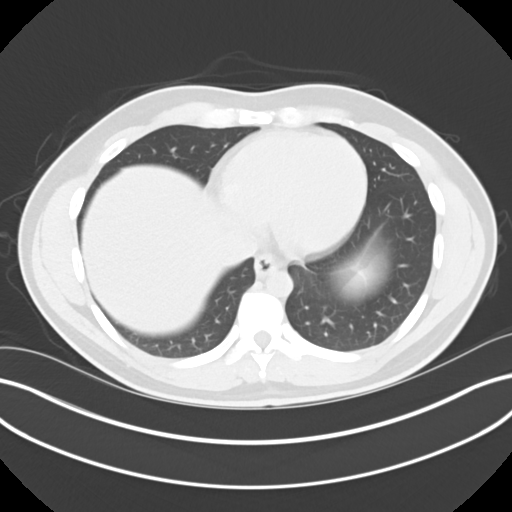
[im 91/101  bone]
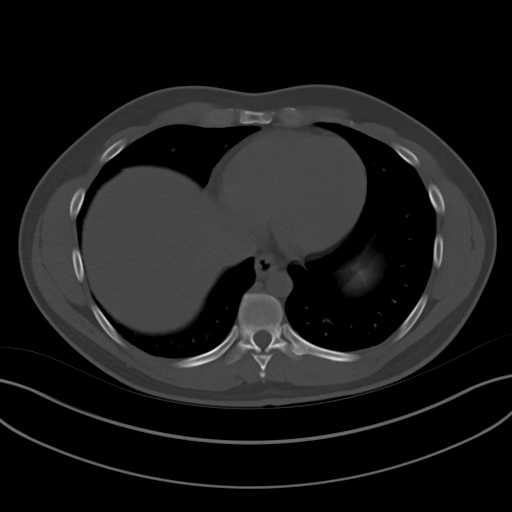

[Series 5: coronal without pre · coronal · non-contrast · 0.77mm/px · 3 of 195 slices shown]
[im 39/195  soft-tissue]
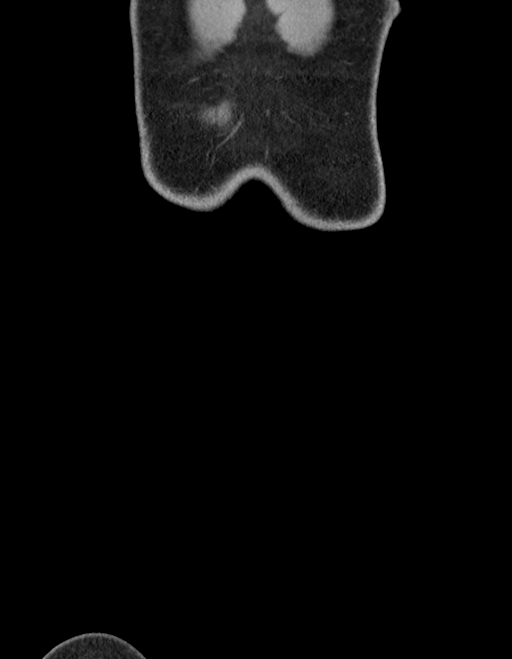
[im 78/195  soft-tissue]
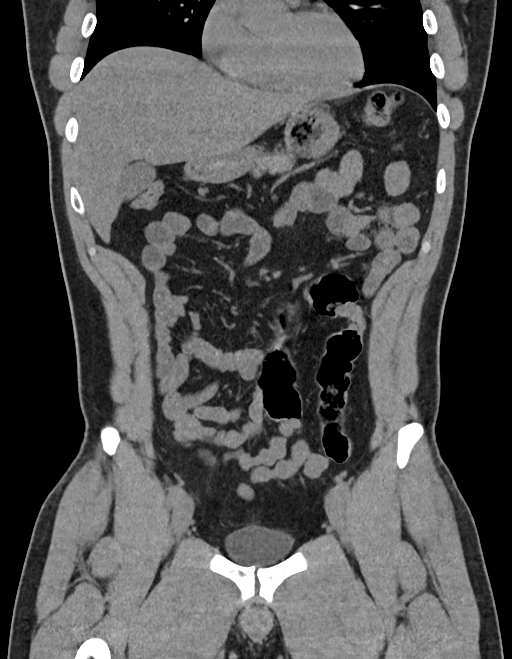
[im 117/195  soft-tissue]
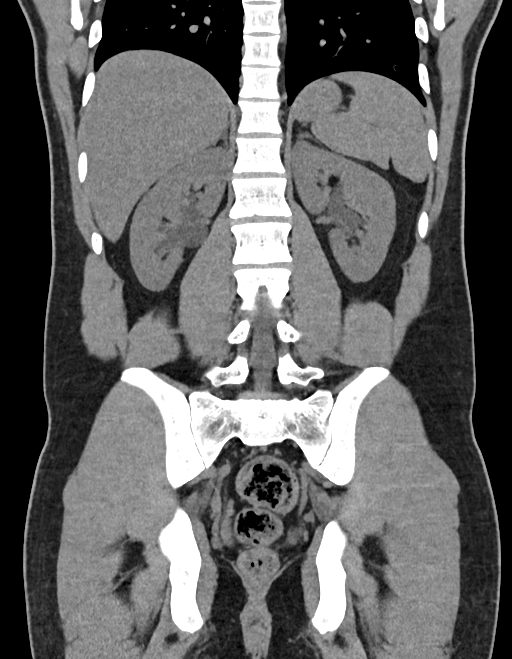

[12 of 46 positions shown; findings below may reference images not displayed]

FINDINGS: Lower chest: Lung bases are clear.

Hepatobiliary: Liver is within normal limits.

Gallbladder is unremarkable. No intrahepatic or extrahepatic ductal
dilatation.

Pancreas: Within normal limits.

Spleen: Within normal limits.

Adrenals/Urinary Tract: Adrenal glands are within normal limits.

Three nonobstructing left renal calculi measuring up to 5 mm in the
lower pole. No ureteral or bladder calculi. No hydronephrosis.

Kidneys are otherwise within normal limits. No enhancing renal
lesions.

On delayed imaging, there are no filling defects in the bilateral
opacified proximal collecting systems, ureters, or bladder.

Bladder is within normal limits.

Stomach/Bowel: Stomach is within normal limits.

No evidence of bowel obstruction.

Normal appendix (series 10/image 68).

Vascular/Lymphatic: No evidence of abdominal aortic aneurysm.

No suspicious abdominopelvic lymphadenopathy.

Reproductive: Prostate is unremarkable.

Other: No abdominopelvic ascites.

Musculoskeletal: Visualized osseous structures are within normal
limits.
IMPRESSION: Three nonobstructing left renal calculi measuring up to 5 mm. No
ureteral or bladder calculi. No hydronephrosis.

No evidence of bowel obstruction.  Normal appendix.

No CT findings to account for the patient's right abdominal pain.

## 2020-02-01 ENCOUNTER — Ambulatory Visit: Payer: 59 | Admitting: Internal Medicine

## 2020-02-01 ENCOUNTER — Encounter: Payer: Self-pay | Admitting: Internal Medicine

## 2020-02-01 ENCOUNTER — Other Ambulatory Visit: Payer: Self-pay

## 2020-02-01 ENCOUNTER — Ambulatory Visit: Payer: 59

## 2020-02-01 VITALS — BP 124/80 | HR 66 | Temp 97.3°F | Ht 69.0 in | Wt 179.0 lb

## 2020-02-01 DIAGNOSIS — Q231 Congenital insufficiency of aortic valve: Secondary | ICD-10-CM | POA: Diagnosis not present

## 2020-02-01 DIAGNOSIS — K4090 Unilateral inguinal hernia, without obstruction or gangrene, not specified as recurrent: Secondary | ICD-10-CM | POA: Diagnosis not present

## 2020-02-01 NOTE — Patient Instructions (Addendum)
COVID-19 Vaccine Information can be found at: [email or web address] For questions related to vaccine distribution or appointments, please email [email or web address] or call [phone].  We will order an ultrasound on the right  Consider seeing Dr. Henderson Newcomer. [facility] general surgery to fix this hernia likely it is a hernia   Hernia, Adult     A hernia is the bulging of an organ or tissue through a weak spot in the muscles of the abdomen (abdominal wall). Hernias develop most often near the belly button (navel) or the area where the leg meets the lower abdomen (groin). Common types of hernias include:  Incisional hernia. This type bulges through a scar from an abdominal surgery.  Umbilical hernia. This type develops near the navel.  Inguinal hernia. This type develops in the groin or scrotum.  Femoral hernia. This type develops under the groin, in the upper thigh area.  Hiatal hernia. This type occurs when part of the stomach slides above the muscle that separates the abdomen from the chest (diaphragm). What are the causes? This condition may be caused by:  Heavy lifting.  Coughing over a long period of time.  Straining to have a bowel movement. Constipation can lead to straining.  An incision made during an abdominal surgery.  A physical problem that is present at birth (congenital defect).  Being overweight or obese.  Smoking.  Excess fluid in the abdomen.  Undescended testicles in males. What are the signs or symptoms? The main symptom is a skin-colored, rounded bulge in the area of the hernia. However, a bulge may not always be present. It may grow bigger or be more visible when you cough or strain (such as when lifting something heavy). A hernia that can be pushed back into the area (is reducible) rarely causes pain. A hernia that cannot be pushed back into the area (is incarcerated) may lose its blood supply  (become strangulated). A hernia that is incarcerated may cause:  Pain.  Fever.  Nausea and vomiting.  Swelling.  Constipation. How is this diagnosed? A hernia may be diagnosed based on:  Your symptoms and medical history.  A physical exam. Your health care provider may ask you to cough or move in certain ways to see if the hernia becomes visible.  Imaging tests, such as: ? X-rays. ? Ultrasound. ? CT scan. How is this treated? A hernia that is small and painless may not need to be treated. A hernia that is large or painful may be treated with surgery. Inguinal hernias may be treated with surgery to prevent incarceration or strangulation. Strangulated hernias are always treated with surgery because a lack of blood supply to the trapped organ or tissue can cause it to die. Surgery to treat a hernia involves pushing the bulge back into place and repairing the weak area of the muscle or abdominal wall. Follow these instructions at home: Activity  Avoid straining.  Do not lift anything that is heavier than 10 lb (4.5 kg), or the limit that you are told, until your health care provider says that it is safe.  When lifting heavy objects, lift with your leg muscles, not your back muscles. Preventing constipation  Take actions to prevent constipation. Constipation leads to straining with bowel movements, which can make a hernia worse or cause a hernia repair to break down. Your health care provider may recommend that you: ? Drink enough fluid to keep your urine pale yellow. ? Eat foods that are high in fiber, such as fresh fruits and vegetables,  whole grains, and beans. ? Limit foods that are high in fat and processed sugars, such as fried or sweet foods. ? Take an over-the-counter or prescription medicine for constipation. General instructions  When coughing, try to cough gently.  You may try to push the hernia back in place by very gently pressing on it while lying down. Do not try  to force the bulge back in if it will not push in easily.  If you are overweight, work with your health care provider to lose weight safely.  Do not use any products that contain nicotine or tobacco, such as cigarettes and e-cigarettes. If you need help quitting, ask your health care provider.  If you are scheduled for hernia repair, watch your hernia for any changes in shape, size, or color. Tell your health care provider about any changes or new symptoms.  Take over-the-counter and prescription medicines only as told by your health care provider.  Keep all follow-up visits as told by your health care provider. This is important. Contact a health care provider if:  You develop new pain, swelling, or redness around your hernia.  You have signs of constipation, such as: ? Fewer bowel movements in a week than normal. ? Difficulty having a bowel movement. ? Stools that are dry, hard, or larger than normal. Get help right away if:  You have a fever.  You have abdomen pain that gets worse.  You feel nauseous or you vomit.  You cannot push the hernia back in place by very gently pressing on it while lying down. Do not try to force the bulge back in if it will not push in easily.  The hernia: ? Changes in shape, size, or color. ? Feels hard or tender. These symptoms may represent a serious problem that is an emergency. Do not wait to see if the symptoms will go away. Get medical help right away. Call your local emergency services (911 in the [place] Summary  A hernia is the bulging of an organ or tissue through a weak spot in the muscles of the abdomen (abdominal wall).  The main symptom is a skin-colored, rounded lump (bulge) in the hernia area. However, a bulge may not always be present. It may grow bigger or more visible when you cough or strain (such as when having a bowel movement).  A hernia that is small and painless may not need to be treated. A hernia that is large or painful  may be treated with surgery.  Surgery to treat a hernia involves pushing the bulge back into place and repairing the weak part of the abdomen. This information is not intended to replace advice given to you by your health care provider. Make sure you discuss any questions you have with your health care provider. Document Revised: 03/29/2019 Document Reviewed: 09/07/2017 Elsevier Patient Education  2020 [employer].  Hernia, en adultos Hernia, Adult     Una hernia es la protrusin de un rgano o un tejido a travs de un punto dbil en los msculos del abdomen (pared abdominal). Las hernias se desarrollan con ms frecuencia cerca del ombligo o en la zona donde se unen las piernas con el abdomen inferior (ingle). Los tipos ms comunes de hernias incluyen:  Hernia incisional. Este tipo sobresale a travs de una cicatriz de una ciruga abdominal.  Hernia umbilical. Este tipo se desarrolla cerca del ombligo.  Hernia inguinal. Este tipo de hernia aparece en la ingle o en el escroto.  Hernia crural. Cleda Clarks  tipo de hernia aparece por debajo de la ingle en la regin superior del muslo.  Hernia de hiato. Se produce cuando parte del estmago se desliza por arriba del msculo que separa el abdomen del trax (diafragma). Cules son las causas? Esta afeccin puede ser causada por lo siguiente:  Levantar peso excesivo.  Toser FedExdurante mucho tiempo.  Hacer un gran esfuerzo para defecar. El estreimiento puede llevar a Comptrollertener que hacer un gran esfuerzo.  Una incisin realizada durante una ciruga abdominal.  Un problema fsico presente desde el nacimiento (defecto congnito).  Tener exceso de Olmitopeso u obesidad.  Fumar.  Exceso de lquido en el abdomen.  Falta de descenso de los Genuine Partstestculos en los hombres. Cules son los signos o sntomas? El sntoma principal es un bulto color piel y redondeado en la zona de la hernia. Sin embargo, el bulto no siempre es visible. Puede hacerse ms grande o  ms visible al toser o Herbalisthacer un esfuerzo (como cuando se levanta un objeto pesado). Una hernia que se puede empujar hacia Photographeradentro en el lugar (es reducible) rara vez causa dolor. Una hernia que no se puede empujar IT consultanthacia adentro en el lugar (encarcelada) puede perder irrigacin sangunea (estrangularse). Una hernia que est encarcelada puede causar lo siguiente:  Dolor.  Grant RutsFiebre.  Nuseas y vmitos.  Hinchazn.  Estreimiento. Cmo se diagnostica? La hernia puede diagnosticarse en funcin de lo siguiente:  Los sntomas y antecedentes mdicos.  Un examen fsico. El mdico puede pedirle que tosa o que se mueva de ciertas maneras para controlar si la hernia se hace visible.  Pruebas de diagnstico por imgenes, por ejemplo: ? Radiografas. ? Ecografa. ? Exploracin por tomografa computarizada (TC). Cmo se trata? Es posible que una hernia pequea e indolora no necesite tratamiento. Una hernia grande o dolorosa puede tratarse con Azerbaijanciruga. Para tratar las hernias inguinales, se puede recurrir a [place] ciruga para Teacher, early years/preevitar el encarcelamiento o la estrangulacin. Las hernias estranguladas siempre se tratan con ciruga porque la falta de irrigacin sangunea al rgano o al tejido atrapados puede causar su muerte. La ciruga para tratar una hernia incluye volver a Scientist, product/process developmentcolocar el bulto en su lugar y reparar la zona dbil del msculo o de la pared abdominal. Siga estas indicaciones en su casa: Actividad  No haga esfuerzos.  No levante ningn objeto que pese ms de 10libras (4,5kg) o el lmite de peso que le haya indicado el mdico hasta que l le diga que es seguro [place]acerlo.  Al levantar objetos pesados, use los msculos de las piernas, no los de la espalda. Prevencin del estreimiento  Tome medidas para Chief Strategy Officerevitar el estreimiento. El estreimiento obliga a Education officer, environmentalrealizar esfuerzos para Advertising copywriterdefecar, los cuales pueden agravar una hernia o causar la rotura de la reparacin. El mdico podra recomendarle que  haga lo siguiente: ? Beba suficiente lquido para mantener la orina de color amarillo plido. ? Consuma alimentos ricos en fibra, como frutas y verduras frescas, cereales integrales y frijoles. ? Limite el consumo de alimentos ricos en grasa y azcares procesados, como alimentos fritos o dulces. ? Tome medicamentos recetados o de venta libre para el estreimiento. Instrucciones generales  Cuando tosa, hgalo con suavidad.  Puede intentar colocar la hernia en su lugar ejerciendo sobre esta una presin muy suave mientras est acostado. No intente forzar el bulto hacia adentro nuevamente si este no entra fcilmente.  Si tiene sobrepeso, trabaje con el mdico para adelgazar sin riegos.  No consuma ningn producto que contenga nicotina o tabaco, como cigarrillos y cigarrillos  electrnicos. Si necesita ayuda para dejar de fumar, consulte al [employer].  Si tiene programada la reparacin de una hernia, controle la hernia para detectar cualquier cambio en la forma, el tamao o el color. Informe al mdico sobre cualquier cambio o sntoma nuevo.  Tome los medicamentos de venta libre y los recetados solamente como se lo haya indicado el mdico.  Consulting civil engineer a todas las visitas de control como se lo haya indicado el mdico. Esto es importante. Comunquese con un mdico si:  Hay dolor, hinchazn o enrojecimiento alrededor de la hernia.  Presenta signos de estreimiento, como los siguientes: ? Defeca menos que lo normal durante una semana. ? Tiene dificultades para defecar. ? Tiene las heces secas y duras, o ms grandes que lo normal. Solicite ayuda de inmediato si:  Tiene fiebre.  Tiene un dolor abdominal que empeora.  Siente nuseas o vomita.  No puede colocar la hernia en su lugar ejerciendo sobre esta una presin muy suave mientras est acostado. No intente forzar el bulto hacia adentro nuevamente si este no entra fcilmente.  La hernia: ? Cambia de forma, tamao o color. ? Est dura al tacto  o causa dolor a la palpacin. Estos sntomas pueden representar un problema grave que constituye Engineer, maintenance (IT). No espere hasta que los sntomas desaparezcan. Solicite atencin mdica de inmediato. Comunquese con el servicio de emergencias de su localidad (911 en los [place]). Resumen  Una hernia es la protrusin de un rgano o un tejido a travs de un punto dbil en los msculos del abdomen (pared abdominal).  El sntoma principal es un bulto color piel y redondeado (protuberancia) en la zona de la hernia. Sin embargo, el bulto no siempre es visible. Puede hacerse ms grande o ms visible al toser o hace un esfuerzo (como cuando Engineer, agricultural).  Es posible que una hernia pequea e indolora no necesite tratamiento. Una hernia grande o dolorosa puede tratarse con [place].  La ciruga para tratar una hernia incluye volver a Dispensing optician bulto en su lugar y reparar la zona dbil del abdomen. Esta informacin no tiene Marine scientist el consejo del mdico. Asegrese de hacerle al mdico cualquier pregunta que tenga. Document Revised: 03/08/2018 Document Reviewed: 10/26/2017 Elsevier Patient Education  2020 [place].    Dietary Guidelines to Help Prevent Kidney Stones Kidney stones are deposits of minerals and salts that form inside your kidneys. Your risk of developing kidney stones may be greater depending on your diet, your lifestyle, the medicines you take, and whether you have certain medical conditions. Most people can reduce their chances of developing kidney stones by following the instructions below. Depending on your overall health and the type of kidney stones you tend to develop, your dietitian may give you more specific instructions. What are tips for following this plan? Reading food labels  Choose foods with "no salt added" or "low-salt" labels. Limit your sodium intake to less than 1500 mg per day.  Choose foods with calcium for each meal and snack. Try to eat  about 300 mg of calcium at each meal. Foods that contain 200-500 mg of calcium per serving include: ? 8 oz (237 ml) of milk, fortified nondairy milk, and fortified fruit juice. ? 8 oz (237 ml) of kefir, yogurt, and soy yogurt. ? 4 oz (118 ml) of tofu. ? 1 oz of cheese. ? 1 cup (300 g) of dried figs. ? 1 cup (91 g) of cooked broccoli. ? 1-3 oz can of sardines or mackerel.  Most  people need 1000 to 1500 mg of calcium each day. Talk to your dietitian about how much calcium is recommended for you. Shopping  Buy plenty of fresh fruits and vegetables. Most people do not need to avoid fruits and vegetables, even if they contain nutrients that may contribute to kidney stones.  When shopping for convenience foods, choose: ? Whole pieces of fruit. ? Premade salads with dressing on the side. ? Low-fat fruit and yogurt smoothies.  Avoid buying frozen meals or prepared deli foods.  Look for foods with live cultures, such as yogurt and kefir. Cooking  Do not add salt to food when cooking. Place a salt shaker on the table and allow each person to add his or her own salt to taste.  Use vegetable protein, such as beans, textured vegetable protein (TVP), or tofu instead of meat in pasta, casseroles, and soups. Meal planning   Eat less salt, if told by your dietitian. To do this: ? Avoid eating processed or premade food. ? Avoid eating fast food.  Eat less animal protein, including cheese, meat, poultry, or fish, if told by your dietitian. To do this: ? Limit the number of times you have meat, poultry, fish, or cheese each week. Eat a diet free of meat at least 2 days a week. ? Eat only one serving each day of meat, poultry, fish, or seafood. ? When you prepare animal protein, cut pieces into small portion sizes. For most meat and fish, one serving is about the size of one deck of cards.  Eat at least 5 servings of fresh fruits and vegetables each day. To do this: ? Keep fruits and vegetables on  hand for snacks. ? Eat 1 piece of fruit or a handful of berries with breakfast. ? Have a salad and fruit at lunch. ? Have two kinds of vegetables at dinner.  Limit foods that are high in a substance called oxalate. These include: ? Spinach. ? Rhubarb. ? Beets. ? Potato chips and french fries. ? Nuts.  If you regularly take a diuretic medicine, make sure to eat at least 1-2 fruits or vegetables high in potassium each day. These include: ? Avocado. ? Banana. ? Orange, prune, carrot, or tomato juice. ? Baked potato. ? Cabbage. ? Beans and split peas. General instructions   Drink enough fluid to keep your urine clear or pale yellow. This is the most important thing you can do.  Talk to your health care provider and dietitian about taking daily supplements. Depending on your health and the cause of your kidney stones, you may be advised: ? Not to take supplements with vitamin C. ? To take a calcium supplement. ? To take a daily probiotic supplement. ? To take other supplements such as magnesium, fish oil, or vitamin B6.  Take all medicines and supplements as told by your health care provider.  Limit alcohol intake to no more than 1 drink a day for nonpregnant women and 2 drinks a day for men. One drink equals 12 oz of beer, 5 oz of wine, or 1 oz of hard liquor.  Lose weight if told by your health care provider. Work with your dietitian to find strategies and an eating plan that works best for you. What foods are not recommended? Limit your intake of the following foods, or as told by your dietitian. Talk to your dietitian about specific foods you should avoid based on the type of kidney stones and your overall health. Grains Breads. Bagels. Rolls. Baked  goods. Salted crackers. Cereal. Pasta. Vegetables Spinach. Rhubarb. Beets. Canned vegetables. Rosita Fire. Olives. Meats and other protein foods Nuts. Nut butters. Large portions of meat, poultry, or fish. Salted or cured meats. Deli  meats. Hot dogs. Sausages. Dairy Cheese. Beverages Regular soft drinks. Regular vegetable juice. Seasonings and other foods Seasoning blends with salt. Salad dressings. Canned soups. Soy sauce. Ketchup. Barbecue sauce. Canned pasta sauce. Casseroles. Pizza. Lasagna. Frozen meals. Potato chips. [place] fries. Summary  You can reduce your risk of kidney stones by making changes to your diet.  The most important thing you can do is drink enough fluid. You should drink enough fluid to keep your urine clear or pale yellow.  Ask your health care provider or dietitian how much protein from animal sources you should eat each day, and also how much salt and calcium you should have each day. This information is not intended to replace advice given to you by your health care provider. Make sure you discuss any questions you have with your health care provider. Document Revised: 03/28/2019 Document Reviewed: 11/16/2016 Elsevier Patient Education  2020 [employer].

## 2020-02-01 NOTE — Addendum Note (Signed)
Addended by: Quentin Ore on: 02/01/2020 11:26 AM   Modules accepted: Orders

## 2020-02-01 NOTE — Progress Notes (Signed)
Chief Complaint  Patient presents with  . Pelvic Pain    ongoing for a year. Pt was told it was not a hernia and that it would heal on its own. Pain never went away. Aggrivated by heaving lifting and working out.    Pelvic pain right sided x 1 year after heavy lifting with the gyn and worsening x 2 weeks 4/10 limiting physical activity and worse with exercise. He noticed this around the summer time having a bulge in right groin and pain but worse in the last 2 weeks  Nothing tried   Review of Systems  Constitutional: Negative for weight loss.  Cardiovascular: Negative for chest pain and palpitations.  Musculoskeletal:       +right groin pain    Neurological: Negative for dizziness and headaches.   Past Medical History:  Diagnosis Date  . Kidney stones    Past Surgical History:  Procedure Laterality Date  . EXTRACORPOREAL SHOCK WAVE LITHOTRIPSY Left 07/05/2019   Procedure: EXTRACORPOREAL SHOCK WAVE LITHOTRIPSY (ESWL);  Surgeon: Royston Cowper, MD;  Location: [facility] ORS;  Service: Urology;  Laterality: Left;   Family History  Problem Relation Age of Onset  . Diabetes Father    Social History   Socioeconomic History  . Marital status: Married    Spouse name: Not on file  . Number of children: Not on file  . Years of education: Not on file  . Highest education level: Not on file  Occupational History  . Not on file  Tobacco Use  . Smoking status: Former Smoker    Quit date: 06/24/2008    Years since quitting: 11.6  . Smokeless tobacco: Never Used  . Tobacco comment: smoker x 6 years >1 ppd and quit in 2009  Substance and Sexual Activity  . Alcohol use: Yes    Alcohol/week: 5.0 standard drinks    Types: 5 Cans of beer per week  . Drug use: Never  . Sexual activity: Yes  Other Topics Concern  . Not on file  Social History Narrative   Married    2 kids boy 60 y.o and 42 month old baby as of 10/17/19   From [place] lived in [place] since 2013 has lived in [place] in 2013    Bachelors degree Recruitment consultant       Social Determinants of Health   Financial Resource Strain:   . Difficulty of Paying Living Expenses: Not on file  Food Insecurity:   . Worried About Charity fundraiser in the Last Year: Not on file  . Ran Out of Food in the Last Year: Not on file  Transportation Needs:   . Lack of Transportation (Medical): Not on file  . Lack of Transportation (Non-Medical): Not on file  Physical Activity:   . Days of Exercise per Week: Not on file  . Minutes of Exercise per Session: Not on file  Stress:   . Feeling of Stress : Not on file  Social Connections:   . Frequency of Communication with Friends and Family: Not on file  . Frequency of Social Gatherings with Friends and Family: Not on file  . Attends Religious Services: Not on file  . Active Member of Clubs or Organizations: Not on file  . Attends Archivist Meetings: Not on file  . Marital Status: Not on file  Intimate Partner Violence:   . Fear of Current or Ex-Partner: Not on file  . Emotionally Abused: Not on file  . Physically Abused: Not  on file  . Sexually Abused: Not on file   No outpatient medications have been marked as taking for the 02/01/20 encounter (Office Visit) with McLean-Scocuzza, Pasty Spillers, MD.   No Known Allergies No results found for this or any previous visit (from the past 2160 hour(s)). Objective  Body mass index is 26.43 kg/m. Wt Readings from Last 3 Encounters:  02/01/20 179 lb (81.2 kg)  12/04/19 180 lb (81.6 kg)  11/09/19 176 lb (79.8 kg)   Temp Readings from Last 3 Encounters:  02/01/20 (!) 97.3 F (36.3 C) (Temporal)  12/04/19 (!) 96.8 F (36 C)  10/17/19 98.3 F (36.8 C) (Skin)   BP Readings from Last 3 Encounters:  02/01/20 124/80  12/04/19 118/66  10/22/19 138/88   Pulse Readings from Last 3 Encounters:  02/01/20 66  12/04/19 (!) 54  10/22/19 (!) 57    Physical Exam Vitals and nursing note reviewed.  Constitutional:       Appearance: Normal appearance.  HENT:     Head: Normocephalic and atraumatic.  Eyes:     Conjunctiva/sclera: Conjunctivae normal.     Pupils: Pupils are equal, round, and reactive to light.  Cardiovascular:     Rate and Rhythm: Normal rate and regular rhythm.     Heart sounds: No murmur.  Pulmonary:     Effort: Pulmonary effort is normal.     Breath sounds: Normal breath sounds.  Abdominal:     General: Abdomen is flat. Bowel sounds are normal.     Palpations: Abdomen is soft.     Hernia: A hernia is present.     Comments: Right sided groin small hernia   Skin:    General: Skin is warm and dry.  Neurological:     General: No focal deficit present.     Mental Status: He is alert and oriented to person, place, and time. Mental status is at baseline.  Psychiatric:        Mood and Affect: Mood normal.        Behavior: Behavior normal.        Thought Content: Thought content normal.        Judgment: Judgment normal.     Assessment  Plan  Right groin hernia - Plan: [place] RT LOWER EXTREM LTD SOFT TISSUE NON VASCULAR Consider Dr. Henderson Newcomer. Lady Gary surgery in future sx'matic hernia wants before 05/20/20  Bicuspid aortic valve f/u cards yearly for echo   HM annual 10/2020  Flu shot had 10/16/19  Tdap per pt had 03/2017 with green card   Former smoker quit 2009 smoking >1ppd x 6 years  rec healthy diet and exercise control stress  Eye Dr. Lew Dawes Dentist Dr. Karilyn Cota  Fasting labs upcoming11/5/21 with physical  F/u leb cards 11/2019 and prn chest pain returns  Monitor BP  Provider: Dr. French Ana McLean-Scocuzza-Internal Medicine

## 2020-02-04 ENCOUNTER — Ambulatory Visit
Admission: RE | Admit: 2020-02-04 | Discharge: 2020-02-04 | Disposition: A | Discharge status: Home / Self Care | Payer: 59 | Source: Ambulatory Visit | Attending: Internal Medicine | Admitting: Internal Medicine

## 2020-02-04 ENCOUNTER — Encounter: Payer: Self-pay | Admitting: Internal Medicine

## 2020-02-04 ENCOUNTER — Other Ambulatory Visit: Payer: Self-pay

## 2020-02-04 DIAGNOSIS — K4090 Unilateral inguinal hernia, without obstruction or gangrene, not specified as recurrent: Secondary | ICD-10-CM | POA: Diagnosis not present

## 2020-02-04 NOTE — Addendum Note (Signed)
Addended by: Quentin Ore on: 02/04/2020 12:20 PM   Modules accepted: Orders

## 2020-02-06 ENCOUNTER — Other Ambulatory Visit: Payer: Self-pay

## 2020-02-06 ENCOUNTER — Ambulatory Visit: Payer: 59 | Admitting: Surgery

## 2020-02-06 ENCOUNTER — Encounter: Payer: Self-pay | Admitting: Surgery

## 2020-02-06 VITALS — BP 147/87 | HR 63 | Temp 97.9°F | Resp 12 | Ht 69.0 in | Wt 181.0 lb

## 2020-02-06 DIAGNOSIS — K4090 Unilateral inguinal hernia, without obstruction or gangrene, not specified as recurrent: Secondary | ICD-10-CM

## 2020-02-06 NOTE — Patient Instructions (Addendum)
Our Surgery scheduler will contact you to schedule your surgery. Please have the [facility] Sheet available when she calls you.   Please call the office if you have any questions or concerns.   Inguinal Hernia, Adult An inguinal hernia is when fat or your intestines push through a weak spot in a muscle where your leg meets your lower belly (groin). This causes a rounded lump (bulge). This kind of hernia could also be:  In your scrotum, if you are male.  In folds of skin around your vagina, if you are male. There are three types of inguinal hernias. These include:  Hernias that can be pushed back into the belly (are reducible). This type rarely causes pain.  Hernias that cannot be pushed back into the belly (are incarcerated).  Hernias that cannot be pushed back into the belly and lose their blood supply (are strangulated). This type needs emergency surgery. If you do not have symptoms, you may not need treatment. If you have symptoms or a large hernia, you may need surgery. Follow these instructions at home: Lifestyle  Do these things if told by your doctor so you do not have trouble pooping (constipation): ? Drink enough fluid to keep your pee (urine) pale yellow. ? Eat foods that have a lot of fiber. These include fresh fruits and vegetables, whole grains, and beans. ? Limit foods that are high in fat and processed sugars. These include foods that are fried or sweet. ? Take medicine for trouble pooping.  Avoid lifting heavy objects.  Avoid standing for long amounts of time.  Do not use any products that contain nicotine or tobacco. These include cigarettes and e-cigarettes. If you need help quitting, ask your doctor.  Stay at a healthy weight. General instructions  You may try to push your hernia in by very gently pressing on it when you are lying down. Do not try to force the bulge back in if it will not push in easily.  Watch your hernia for any changes in shape, size, or  color. Tell your doctor if you see any changes.  Take over-the-counter and prescription medicines only as told by your doctor.  Keep all follow-up visits as told by your doctor. This is important. Contact a doctor if:  You have a fever.  You have new symptoms.  Your symptoms get worse. Get help right away if:  The area where your leg meets your lower belly has: ? Pain that gets worse suddenly. ? A bulge that gets bigger suddenly, and it does not get smaller after that. ? A bulge that turns red or purple. ? A bulge that is painful when you touch it.  You are a man, and your scrotum: ? Suddenly feels painful. ? Suddenly changes in size.  You cannot push the hernia in by very gently pressing on it when you are lying down. Do not try to force the bulge back in if it will not push in easily.  You feel sick to your stomach (nauseous), and that feeling does not go away.  You throw up (vomit), and that keeps happening.  You have a fast heartbeat.  You cannot poop (have a bowel movement) or pass gas. These symptoms may be an emergency. Do not wait to see if the symptoms will go away. Get medical help right away. Call your local emergency services (911 in the [place] Summary  An inguinal hernia is when fat or your intestines push through a weak spot in a muscle  where your leg meets your lower belly (groin). This causes a rounded lump (bulge).  If you do not have symptoms, you may not need treatment. If you have symptoms or a large hernia, you may need surgery.  Avoid lifting heavy objects. Also avoid standing for long amounts of time.  Do not try to force the bulge back in if it will not push in easily. This information is not intended to replace advice given to you by your health care provider. Make sure you discuss any questions you have with your health care provider. Document Revised: 01/07/2018 Document Reviewed: 09/07/2017 Elsevier Patient Education  [employer].

## 2020-02-07 ENCOUNTER — Encounter: Payer: Self-pay | Admitting: Surgery

## 2020-02-07 NOTE — Progress Notes (Signed)
Patient ID: Louis Bird, male   DOB: [birth year], 34 y.o.   MRN: [id number]  HPI Louis Bird is a 34 y.o. male seen in consultation at the request of Dr. Terese Door for a right inguinal hernia.  Reports that he has had some inguinal discomfort for the last year or so and is only after training and going to the gym symptoms exacerbated.  The pain is mild to moderate nature and is dull and intermittent.  Is aggravated by certain movements.  Currently the patient has much improved after the patient limited the exercise and his activities.  He now feels a bulge on the right inguinal area.  He denies any previous abdominal operations and he is very healthy.  He is able to perform more than 6 METS of activity without any shortness of breath or chest pain.  He does have a bicuspid valve that has caused no symptoms.  He has had a CT scan as well as an ultrasound that I have personally reviewed.  On the CT scan I did not see a hernia defect but the CT scan was over a year old.  On ultrasound however there is a clear defect on the right inguinal canal.  He denies any fevers any chills any shortness of breath.  Any easy bruising.  His CBC and CMP were completely normal  HPI  Past Medical History:  Diagnosis Date  . Kidney stones     Past Surgical History:  Procedure Laterality Date  . EXTRACORPOREAL SHOCK WAVE LITHOTRIPSY Left 07/05/2019   Procedure: EXTRACORPOREAL SHOCK WAVE LITHOTRIPSY (ESWL);  Surgeon: Royston Cowper, MD;  Location: [facility] ORS;  Service: Urology;  Laterality: Left;    Family History  Problem Relation Age of Onset  . Diabetes Father     Social History Social History   Tobacco Use  . Smoking status: Former Smoker    Quit date: 06/24/2008    Years since quitting: 11.6  . Smokeless tobacco: Never Used  . Tobacco comment: smoker x 6 years >1 ppd and quit in 2009  Substance Use Topics  . Alcohol use: Yes    Alcohol/week: 5.0 standard drinks    Types: 5 Cans of beer per week  .  Drug use: Never    No Known Allergies  No current outpatient medications on file.   No current facility-administered medications for this visit.     Review of Systems Full ROS  was asked and was negative except for the information on the HPI  Physical Exam Blood pressure (!) 147/87, pulse 63, temperature 97.9 F (36.6 C), resp. rate 12, height 5\' 9"  (1.753 m), weight 181 lb (82.1 kg), SpO2 97 %. CONSTITUTIONAL: NAD EYES: Pupils are equal, round, and reactive to light, Sclera are non-icteric. EARS, NOSE, MOUTH AND THROAT: Wearing a mask /cvid precautions. t. Hearing is intact to voice. LYMPH NODES:  Lymph nodes in the neck are normal. RESPIRATORY:  Lungs are clear. There is normal respiratory effort, with equal breath sounds bilaterally, and without pathologic use of accessory muscles. CARDIOVASCULAR: Heart is regular without murmurs, gallops, or rubs. GI: The abdomen is  soft, nontender, and nondistended. There are no palpable masses. There is no hepatosplenomegaly. There are normal bowel sounds in all quadrants. There is evidence of reducible but mildly tender RIH. GU: Rectal deferred.   MUSCULOSKELETAL: Normal muscle strength and tone. No cyanosis or edema.   SKIN: Turgor is good and there are no pathologic skin lesions or ulcers. NEUROLOGIC: Motor and sensation is grossly  normal. Cranial nerves are grossly intact. PSYCH:  Oriented to person, place and time. Affect is normal.  Data Reviewed  I have personally reviewed the patient's imaging, laboratory findings and medical records.    Assessment/Plan  34 year old male with no major medical comorbidities presents with a symptomatic but reducible right inguinal hernia.  Given symptoms I do recommend repair.  I do think that he will be a great candidate for robotic approach.  I had an extensive discussion with the patient regarding the procedure.  Risk, benefits and possible complications including but not limited to: Bleeding,  infection, recurrence, injury to adjacent structures and chronic pain.  He understands and wishes to proceed.  We also discussed the different approaches with purely laparoscopically versus open as well as the our availability of polypropylene meshes.  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  He also understands that if we find bilateral fax we will go ahead and fix both during the same operative setting.  We also discussed the postoperative expectations and limitations. Extensive counseling provided. A copy of this report was sent to the referring provider    Sterling Big, MD FACS General Surgeon 02/07/2020, 11:25 AM

## 2020-02-07 NOTE — H&P (View-Only) (Signed)
Patient ID: Louis Bird, male   DOB: 07/09/1986, 33 y.o.   MRN: [id number]  HPI Louis Bird is a 33 y.o. male seen in consultation at the request of Dr. McLean-Scocuzza for a right inguinal hernia.  Reports that he has had some inguinal discomfort for the last year or so and is only after training and going to the gym symptoms exacerbated.  The pain is mild to moderate nature and is dull and intermittent.  Is aggravated by certain movements.  Currently the patient has much improved after the patient limited the exercise and his activities.  He now feels a bulge on the right inguinal area.  He denies any previous abdominal operations and he is very healthy.  He is able to perform more than 6 METS of activity without any shortness of breath or chest pain.  He does have a bicuspid valve that has caused no symptoms.  He has had a CT scan as well as an ultrasound that I have personally reviewed.  On the CT scan I did not see a hernia defect but the CT scan was over a year old.  On ultrasound however there is a clear defect on the right inguinal canal.  He denies any fevers any chills any shortness of breath.  Any easy bruising.  His CBC and CMP were completely normal  HPI  Past Medical History:  Diagnosis Date  . Kidney stones     Past Surgical History:  Procedure Laterality Date  . EXTRACORPOREAL SHOCK WAVE LITHOTRIPSY Left 07/05/2019   Procedure: EXTRACORPOREAL SHOCK WAVE LITHOTRIPSY (ESWL);  Surgeon: Wolff, Michael R, MD;  Location: [facility] ORS;  Service: Urology;  Laterality: Left;    Family History  Problem Relation Age of Onset  . Diabetes Father     Social History Social History   Tobacco Use  . Smoking status: Former Smoker    Quit date: 06/24/2008    Years since quitting: 11.6  . Smokeless tobacco: Never Used  . Tobacco comment: smoker x 6 years >1 ppd and quit in 2009  Substance Use Topics  . Alcohol use: Yes    Alcohol/week: 5.0 standard drinks    Types: 5 Cans of beer per week  .  Drug use: Never    No Known Allergies  No current outpatient medications on file.   No current facility-administered medications for this visit.     Review of Systems Full ROS  was asked and was negative except for the information on the HPI  Physical Exam Blood pressure (!) 147/87, pulse 63, temperature 97.9 F (36.6 C), resp. rate 12, height 5' 9" (1.753 m), weight 181 lb (82.1 kg), SpO2 97 %. CONSTITUTIONAL: NAD EYES: Pupils are equal, round, and reactive to light, Sclera are non-icteric. EARS, NOSE, MOUTH AND THROAT: Wearing a mask /cvid precautions. t. Hearing is intact to voice. LYMPH NODES:  Lymph nodes in the neck are normal. RESPIRATORY:  Lungs are clear. There is normal respiratory effort, with equal breath sounds bilaterally, and without pathologic use of accessory muscles. CARDIOVASCULAR: Heart is regular without murmurs, gallops, or rubs. GI: The abdomen is  soft, nontender, and nondistended. There are no palpable masses. There is no hepatosplenomegaly. There are normal bowel sounds in all quadrants. There is evidence of reducible but mildly tender RIH. GU: Rectal deferred.   MUSCULOSKELETAL: Normal muscle strength and tone. No cyanosis or edema.   SKIN: Turgor is good and there are no pathologic skin lesions or ulcers. NEUROLOGIC: Motor and sensation is grossly   normal. Cranial nerves are grossly intact. PSYCH:  Oriented to person, place and time. Affect is normal.  Data Reviewed  I have personally reviewed the patient's imaging, laboratory findings and medical records.    Assessment/Plan  34 year old male with no major medical comorbidities presents with a symptomatic but reducible right inguinal hernia.  Given symptoms I do recommend repair.  I do think that he will be a great candidate for robotic approach.  I had an extensive discussion with the patient regarding the procedure.  Risk, benefits and possible complications including but not limited to: Bleeding,  infection, recurrence, injury to adjacent structures and chronic pain.  He understands and wishes to proceed.  We also discussed the different approaches with purely laparoscopically versus open as well as the our availability of polypropylene meshes.  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  He also understands that if we find bilateral fax we will go ahead and fix both during the same operative setting.  We also discussed the postoperative expectations and limitations. Extensive counseling provided. A copy of this report was sent to the referring provider    Sterling Big, MD FACS General Surgeon 02/07/2020, 11:25 AM

## 2020-02-12 ENCOUNTER — Telehealth: Payer: Self-pay | Admitting: Surgery

## 2020-02-12 NOTE — Telephone Encounter (Signed)
Outbound call made to the patient in an attempt to provide surgery information, however he was not home.  Louis Bird will be calling back in the morning, so please provide the following information when he calls:  Surgery Date: 02/28/20 Preadmission Testing Date: 02/18/20 (1p-5p) Covid Testing Date: 02/26/20 - patient advised to [facility] to [facility] ([address] [facility])  Patient has been made aware to call [phone], between 1-3:00pm the day before surgery, to find out what time to arrive.

## 2020-02-18 ENCOUNTER — Other Ambulatory Visit: Payer: Self-pay

## 2020-02-18 ENCOUNTER — Encounter
Admission: RE | Admit: 2020-02-18 | Discharge: 2020-02-18 | Disposition: A | Discharge status: Home / Self Care | Payer: 59 | Source: Ambulatory Visit | Attending: Surgery | Admitting: Surgery

## 2020-02-18 DIAGNOSIS — Z01818 Encounter for other preprocedural examination: Secondary | ICD-10-CM | POA: Insufficient documentation

## 2020-02-18 HISTORY — DX: Personal history of urinary calculi: Z87.442

## 2020-02-18 NOTE — Patient Instructions (Signed)
Your procedure is scheduled on: Thursday 02/28/20.  Report to DAY SURGERY DEPARTMENT LOCATED ON 2ND FLOOR MEDICAL MALL ENTRANCE. To find out your arrival time please call [phone] between 1PM - 3PM on Wednesday 02/27/20.   Remember: Instructions that are not followed completely may result in serious medical risk, up to and including death, or upon the discretion of your surgeon and anesthesiologist your surgery may need to be rescheduled.      _X__ 1. Do not eat food after midnight the night before your procedure.                 No gum chewing or hard candies. You may drink clear liquids up to 2 hours                 before you are scheduled to arrive for your surgery- DO NOT drink clear                 liquids within 2 hours of the start of your surgery.                 Clear Liquids include:  water, apple juice without pulp, clear carbohydrate                 drink such as Clearfast or Gatorade, Black Coffee or Tea (Do not add                 anything to coffee or tea).    __X__2.  On the morning of surgery brush your teeth with toothpaste and water, you may rinse your mouth with mouthwash if you wish.  Do not swallow any toothpaste or mouthwash.       _X__ 3.  No Alcohol for 24 hours before or after surgery.     _X__ 4.  Do Not Smoke or use e-cigarettes For 24 Hours Prior to Your Surgery.                 Do not use any chewable tobacco products for at least 6 hours prior to                 surgery.   __X__5.  Notify your doctor if there is any change in your medical condition      (cold, fever, infections).       Do not wear jewelry, make-up, hairpins, clips or nail polish. Do not wear lotions, powders, or perfumes.  Do not shave 48 hours prior to surgery. Men may shave face and neck. Do not bring valuables to the hospital.      [facility] is not responsible for any belongings or valuables.   Contacts, dentures/partials or body piercings may not be worn into  surgery. Bring a case for your contacts, glasses or hearing aids, a denture cup will be supplied.    Patients discharged the day of surgery will not be allowed to drive home.     __X__ Take these medicines the morning of surgery with A SIP OF WATER:     1. NONE     __X__ Use CHG Soap as directed    __X__ Stop Anti-inflammatories 7 days before surgery such as Advil, Ibuprofen, Motrin, BC or Goodies Powder, Naprosyn, Naproxen, Aleve, Aspirin, Meloxicam. May take Tylenol if needed for pain or discomfort.

## 2020-02-26 ENCOUNTER — Other Ambulatory Visit: Payer: Self-pay

## 2020-02-26 ENCOUNTER — Other Ambulatory Visit
Admission: RE | Admit: 2020-02-26 | Discharge: 2020-02-26 | Disposition: A | Discharge status: Home / Self Care | Payer: 59 | Source: Ambulatory Visit | Attending: Surgery | Admitting: Surgery

## 2020-02-26 DIAGNOSIS — Z20822 Contact with and (suspected) exposure to covid-19: Secondary | ICD-10-CM | POA: Diagnosis not present

## 2020-02-26 DIAGNOSIS — Z01812 Encounter for preprocedural laboratory examination: Secondary | ICD-10-CM | POA: Diagnosis present

## 2020-02-26 LAB — SARS CORONAVIRUS 2 (TAT 6-24 HRS): SARS Coronavirus 2: NEGATIVE

## 2020-02-28 ENCOUNTER — Ambulatory Visit: Payer: No Typology Code available for payment source | Admitting: Anesthesiology

## 2020-02-28 ENCOUNTER — Ambulatory Visit
Admission: RE | Admit: 2020-02-28 | Discharge: 2020-02-28 | Disposition: A | Discharge status: Home / Self Care | Payer: No Typology Code available for payment source | Source: Ambulatory Visit | Attending: Surgery | Admitting: Surgery

## 2020-02-28 ENCOUNTER — Encounter
Admission: RE | Disposition: A | Discharge status: Home / Self Care | Payer: Self-pay | Source: Ambulatory Visit | Attending: Surgery

## 2020-02-28 ENCOUNTER — Other Ambulatory Visit: Payer: Self-pay

## 2020-02-28 ENCOUNTER — Encounter: Payer: Self-pay | Admitting: Surgery

## 2020-02-28 DIAGNOSIS — K4090 Unilateral inguinal hernia, without obstruction or gangrene, not specified as recurrent: Secondary | ICD-10-CM

## 2020-02-28 DIAGNOSIS — Z87891 Personal history of nicotine dependence: Secondary | ICD-10-CM | POA: Insufficient documentation

## 2020-02-28 DIAGNOSIS — K4020 Bilateral inguinal hernia, without obstruction or gangrene, not specified as recurrent: Secondary | ICD-10-CM | POA: Diagnosis present

## 2020-02-28 DIAGNOSIS — I4510 Unspecified right bundle-branch block: Secondary | ICD-10-CM | POA: Diagnosis not present

## 2020-02-28 HISTORY — PX: XI ROBOTIC ASSISTED INGUINAL HERNIA REPAIR WITH MESH: SHX6706

## 2020-02-28 SURGERY — REPAIR, HERNIA, INGUINAL, ROBOT-ASSISTED, LAPAROSCOPIC, USING MESH
Anesthesia: General | Site: Groin | Laterality: Bilateral

## 2020-02-28 MED ORDER — DEXAMETHASONE SODIUM PHOSPHATE 10 MG/ML IJ SOLN
INTRAMUSCULAR | Status: DC | PRN
  Administered 2020-02-28: 10 mg via INTRAVENOUS

## 2020-02-28 MED ORDER — CHLORHEXIDINE GLUCONATE CLOTH 2 % EX PADS
6.0000 | MEDICATED_PAD | Freq: Once | CUTANEOUS | Status: AC
  Administered 2020-02-28: 6 via TOPICAL

## 2020-02-28 MED ORDER — LACTATED RINGERS IV SOLN: INTRAVENOUS | Status: DC

## 2020-02-28 MED ORDER — SUGAMMADEX SODIUM 200 MG/2ML IV SOLN
INTRAVENOUS | Status: DC | PRN
  Administered 2020-02-28: 150 mg via INTRAVENOUS

## 2020-02-28 MED ORDER — ACETAMINOPHEN 160 MG/5ML PO SOLN
325.0000 mg | ORAL | Status: DC | PRN
  Filled 2020-02-28: qty 20.3

## 2020-02-28 MED ORDER — LACTATED RINGERS IV SOLN: INTRAVENOUS | Status: DC | PRN

## 2020-02-28 MED ORDER — HYDROMORPHONE HCL 1 MG/ML IJ SOLN
INTRAMUSCULAR | Status: DC | PRN
  Administered 2020-02-28 (×2): .5 mg via INTRAVENOUS

## 2020-02-28 MED ORDER — HYDROCODONE-ACETAMINOPHEN 7.5-325 MG PO TABS
1.0000 | ORAL_TABLET | Freq: Once | ORAL | Status: DC | PRN
  Filled 2020-02-28: qty 1

## 2020-02-28 MED ORDER — PROMETHAZINE HCL 25 MG/ML IJ SOLN: 6.2500 mg | INTRAMUSCULAR | Status: DC | PRN

## 2020-02-28 MED ORDER — ACETAMINOPHEN 325 MG PO TABS: 325.0000 mg | ORAL_TABLET | ORAL | Status: DC | PRN

## 2020-02-28 MED ORDER — CEFAZOLIN SODIUM-DEXTROSE 2-4 GM/100ML-% IV SOLN
INTRAVENOUS | Status: AC
  Filled 2020-02-28: qty 100

## 2020-02-28 MED ORDER — ROCURONIUM BROMIDE 100 MG/10ML IV SOLN
INTRAVENOUS | Status: DC | PRN
  Administered 2020-02-28 (×2): 50 mg via INTRAVENOUS

## 2020-02-28 MED ORDER — PROPOFOL 10 MG/ML IV BOLUS
INTRAVENOUS | Status: AC
  Filled 2020-02-28: qty 40

## 2020-02-28 MED ORDER — GABAPENTIN 300 MG PO CAPS: 300.0000 mg | ORAL_CAPSULE | ORAL | Status: AC

## 2020-02-28 MED ORDER — EPINEPHRINE PF 1 MG/ML IJ SOLN
INTRAMUSCULAR | Status: AC
  Filled 2020-02-28: qty 1

## 2020-02-28 MED ORDER — FAMOTIDINE 20 MG PO TABS: 20.0000 mg | ORAL_TABLET | Freq: Once | ORAL | Status: AC

## 2020-02-28 MED ORDER — BUPIVACAINE LIPOSOME 1.3 % IJ SUSP
INTRAMUSCULAR | Status: DC | PRN
  Administered 2020-02-28: 20 mL

## 2020-02-28 MED ORDER — BUPIVACAINE HCL (PF) 0.25 % IJ SOLN
INTRAMUSCULAR | Status: AC
  Filled 2020-02-28: qty 30

## 2020-02-28 MED ORDER — ONDANSETRON HCL 4 MG/2ML IJ SOLN
INTRAMUSCULAR | Status: DC | PRN
  Administered 2020-02-28: 4 mg via INTRAVENOUS

## 2020-02-28 MED ORDER — LIDOCAINE HCL (PF) 2 % IJ SOLN
INTRAMUSCULAR | Status: AC
  Filled 2020-02-28: qty 5

## 2020-02-28 MED ORDER — FENTANYL CITRATE (PF) 100 MCG/2ML IJ SOLN
25.0000 ug | INTRAMUSCULAR | Status: DC | PRN
  Administered 2020-02-28: 25 ug via INTRAVENOUS

## 2020-02-28 MED ORDER — ACETAMINOPHEN 500 MG PO TABS
ORAL_TABLET | ORAL | Status: AC
  Administered 2020-02-28: 1000 mg via ORAL
  Filled 2020-02-28: qty 2

## 2020-02-28 MED ORDER — CELECOXIB 200 MG PO CAPS
ORAL_CAPSULE | ORAL | Status: AC
  Administered 2020-02-28: 200 mg via ORAL
  Filled 2020-02-28: qty 1

## 2020-02-28 MED ORDER — KETOROLAC TROMETHAMINE 30 MG/ML IJ SOLN: 30.0000 mg | Freq: Once | INTRAMUSCULAR | Status: AC | PRN

## 2020-02-28 MED ORDER — ACETAMINOPHEN 500 MG PO TABS: 1000.0000 mg | ORAL_TABLET | ORAL | Status: AC

## 2020-02-28 MED ORDER — GABAPENTIN 300 MG PO CAPS
ORAL_CAPSULE | ORAL | Status: AC
  Administered 2020-02-28: 300 mg via ORAL
  Filled 2020-02-28: qty 1

## 2020-02-28 MED ORDER — HYDROCODONE-ACETAMINOPHEN 5-325 MG PO TABS: 1.0000 | ORAL_TABLET | ORAL | 0 refills | Status: DC | PRN

## 2020-02-28 MED ORDER — PROPOFOL 10 MG/ML IV BOLUS
INTRAVENOUS | Status: DC | PRN
  Administered 2020-02-28: 140 mg via INTRAVENOUS

## 2020-02-28 MED ORDER — MIDAZOLAM HCL 2 MG/2ML IJ SOLN
INTRAMUSCULAR | Status: AC
  Filled 2020-02-28: qty 2

## 2020-02-28 MED ORDER — KETOROLAC TROMETHAMINE 30 MG/ML IJ SOLN
INTRAMUSCULAR | Status: AC
  Administered 2020-02-28: 30 mg via INTRAVENOUS
  Filled 2020-02-28: qty 1

## 2020-02-28 MED ORDER — ROCURONIUM BROMIDE 10 MG/ML (PF) SYRINGE
PREFILLED_SYRINGE | INTRAVENOUS | Status: AC
  Filled 2020-02-28: qty 10

## 2020-02-28 MED ORDER — HYDROMORPHONE HCL 1 MG/ML IJ SOLN
INTRAMUSCULAR | Status: AC
  Filled 2020-02-28: qty 1

## 2020-02-28 MED ORDER — DEXMEDETOMIDINE HCL IN NACL 200 MCG/50ML IV SOLN
INTRAVENOUS | Status: DC | PRN
  Administered 2020-02-28 (×2): 4 ug via INTRAVENOUS

## 2020-02-28 MED ORDER — CELECOXIB 200 MG PO CAPS: 200.0000 mg | ORAL_CAPSULE | ORAL | Status: AC

## 2020-02-28 MED ORDER — BUPIVACAINE-EPINEPHRINE 0.25% -1:200000 IJ SOLN
INTRAMUSCULAR | Status: DC | PRN
  Administered 2020-02-28: 30 mL

## 2020-02-28 MED ORDER — FENTANYL CITRATE (PF) 100 MCG/2ML IJ SOLN
INTRAMUSCULAR | Status: DC | PRN
  Administered 2020-02-28: 100 ug via INTRAVENOUS

## 2020-02-28 MED ORDER — FENTANYL CITRATE (PF) 100 MCG/2ML IJ SOLN
INTRAMUSCULAR | Status: AC
  Filled 2020-02-28: qty 2

## 2020-02-28 MED ORDER — CEFAZOLIN SODIUM-DEXTROSE 2-4 GM/100ML-% IV SOLN
2.0000 g | INTRAVENOUS | Status: AC
  Administered 2020-02-28: 2 g via INTRAVENOUS

## 2020-02-28 MED ORDER — ONDANSETRON HCL 4 MG/2ML IJ SOLN
INTRAMUSCULAR | Status: AC
  Filled 2020-02-28: qty 2

## 2020-02-28 MED ORDER — FAMOTIDINE 20 MG PO TABS
ORAL_TABLET | ORAL | Status: AC
  Administered 2020-02-28: 20 mg via ORAL
  Filled 2020-02-28: qty 1

## 2020-02-28 MED ORDER — MIDAZOLAM HCL 2 MG/2ML IJ SOLN
INTRAMUSCULAR | Status: DC | PRN
  Administered 2020-02-28: 2 mg via INTRAVENOUS

## 2020-02-28 SURGICAL SUPPLY — 45 items
CANISTER SUCT 1200ML W/VALVE (MISCELLANEOUS) ×3 IMPLANT
CHLORAPREP W/TINT 26 (MISCELLANEOUS) ×3 IMPLANT
COVER TIP SHEARS 8 DVNC (MISCELLANEOUS) ×1 IMPLANT
COVER TIP SHEARS 8MM DA VINCI (MISCELLANEOUS) ×2
COVER WAND RF STERILE (DRAPES) ×3 IMPLANT
DEFOGGER SCOPE WARMER CLEARIFY (MISCELLANEOUS) ×3 IMPLANT
DERMABOND ADVANCED (GAUZE/BANDAGES/DRESSINGS) ×2
DERMABOND ADVANCED .7 DNX12 (GAUZE/BANDAGES/DRESSINGS) ×1 IMPLANT
DRAPE 3/4 80X56 (DRAPES) ×3 IMPLANT
DRAPE ARM DVNC X/XI (DISPOSABLE) ×3 IMPLANT
DRAPE COLUMN DVNC XI (DISPOSABLE) ×1 IMPLANT
DRAPE DA VINCI XI ARM (DISPOSABLE) ×6
DRAPE DA VINCI XI COLUMN (DISPOSABLE) ×2
ELECT CAUTERY BLADE 6.4 (BLADE) ×3 IMPLANT
ELECT REM PT RETURN 9FT ADLT (ELECTROSURGICAL) ×3
ELECTRODE REM PT RTRN 9FT ADLT (ELECTROSURGICAL) ×1 IMPLANT
GLOVE BIO SURGEON STRL SZ7 (GLOVE) ×12 IMPLANT
GOWN STRL REUS W/ TWL LRG LVL3 (GOWN DISPOSABLE) ×4 IMPLANT
GOWN STRL REUS W/TWL LRG LVL3 (GOWN DISPOSABLE) ×8
IRRIGATION STRYKERFLOW (MISCELLANEOUS) IMPLANT
IRRIGATOR STRYKERFLOW (MISCELLANEOUS)
IV NS 1000ML (IV SOLUTION)
IV NS 1000ML BAXH (IV SOLUTION) IMPLANT
KIT PINK PAD W/HEAD ARE REST (MISCELLANEOUS) ×3
KIT PINK PAD W/HEAD ARM REST (MISCELLANEOUS) ×1 IMPLANT
LABEL OR SOLS (LABEL) ×3 IMPLANT
MESH 3DMAX 4X6 LT LRG (Mesh General) ×3 IMPLANT
MESH 3DMAX 4X6 RT LRG (Mesh General) ×3 IMPLANT
NEEDLE HYPO 22GX1.5 SAFETY (NEEDLE) ×3 IMPLANT
OBTURATOR OPTICAL STANDARD 8MM (TROCAR) ×2
OBTURATOR OPTICAL STND 8 DVNC (TROCAR) ×1
OBTURATOR OPTICALSTD 8 DVNC (TROCAR) ×1 IMPLANT
PACK LAP CHOLECYSTECTOMY (MISCELLANEOUS) ×3 IMPLANT
PENCIL ELECTRO HAND CTR (MISCELLANEOUS) ×3 IMPLANT
SEAL CANN UNIV 5-8 DVNC XI (MISCELLANEOUS) ×3 IMPLANT
SEAL XI 5MM-8MM UNIVERSAL (MISCELLANEOUS) ×6
SOLUTION ELECTROLUBE (MISCELLANEOUS) ×3 IMPLANT
SPONGE LAP 18X18 RF (DISPOSABLE) ×3 IMPLANT
SUT MNCRL AB 4-0 PS2 18 (SUTURE) ×6 IMPLANT
SUT VIC AB 2-0 SH 27 (SUTURE) ×4
SUT VIC AB 2-0 SH 27XBRD (SUTURE) ×2 IMPLANT
SUT VICRYL 0 AB UR-6 (SUTURE) ×6 IMPLANT
SUT VLOC 90 S/L VL9 GS22 (SUTURE) ×6 IMPLANT
TROCAR BALLN GELPORT 12X130M (ENDOMECHANICALS) ×3 IMPLANT
TUBING EVAC SMOKE HEATED PNEUM (TUBING) ×3 IMPLANT

## 2020-02-28 NOTE — Transfer of Care (Signed)
Immediate Anesthesia Transfer of Care Note  Patient: Louis Bird  Procedure(s) Performed: XI ROBOTIC ASSISTED BILATERAL INGUINAL HERNIA REPAIR WITH MESH (Bilateral Groin)  Patient Location: PACU  Anesthesia Type:General  Level of Consciousness: drowsy  Airway & Oxygen Therapy: Patient Spontanous Breathing and Patient connected to face mask oxygen  Post-op Assessment: Report given to RN and Post -op Vital signs reviewed and stable    Post vital signs: stable  Last Vitals:  Vitals Value Taken Time  BP 145/82 02/28/20 1008  Temp 36.2 C 02/28/20 1008  Pulse 66 02/28/20 1011  Resp 17 02/28/20 1011  SpO2 100 % 02/28/20 1011  Vitals shown include unvalidated device data.  Last Pain:  Vitals:   02/28/20 0626  TempSrc: Tympanic  PainSc: 0-No pain         Complications: No apparent anesthesia complications

## 2020-02-28 NOTE — Anesthesia Procedure Notes (Signed)
Procedure Name: Intubation Date/Time: 02/28/2020 7:42 AM Performed by: Gentry Fitz, CRNA Pre-anesthesia Checklist: Patient identified, Emergency Drugs available, Suction available and Patient being monitored Patient Re-evaluated:Patient Re-evaluated prior to induction Oxygen Delivery Method: Circle system utilized Preoxygenation: Pre-oxygenation with 100% oxygen Induction Type: IV induction Ventilation: Mask ventilation without difficulty Laryngoscope Size: Mac and 4 Grade View: Grade II Tube type: Oral Tube size: 7.5 mm Number of attempts: 1 Airway Equipment and Method: Stylet Placement Confirmation: ETT inserted through vocal cords under direct vision,  positive ETCO2 and breath sounds checked- equal and bilateral Secured at: 22 cm Tube secured with: Tape

## 2020-02-28 NOTE — Discharge Instructions (Addendum)
Laparoscopic Inguinal Hernia Repair, Adult, Care After This sheet gives you information about how to care for yourself after your procedure. Your health care provider may also give you more specific instructions. If you have problems or questions, contact your health care provider. What can I expect after the procedure? After the procedure, it is common to have:  Pain.  Swelling and bruising around the incision area.  Scrotal swelling, in men.  Some fluid or blood draining from your incisions. Follow these instructions at home: Incision care  Follow instructions from your health care provider about how to take care of your incisions. Make sure you: ? Wash your hands with soap and water before you change your bandage (dressing). If soap and water are not available, use hand sanitizer. ? Change your dressing as told by your health care provider. ? Leave stitches (sutures), skin glue, or adhesive strips in place. These skin closures may need to stay in place for 2 weeks or longer. If adhesive strip edges start to loosen and curl up, you may trim the loose edges. Do not remove adhesive strips completely unless your health care provider tells you to do that.  Check your incision area every day for signs of infection. Check for: ? More redness, swelling, or pain. ? More fluid or blood. ? Warmth. ? Pus or a bad smell.  Wear loose, soft clothing while your incisions heal. Driving  Do not drive or use heavy machinery while taking prescription pain medicine.  Do not drive for 24 hours if you were given a medicine to help you relax (sedative) during your procedure. Activity  Do not lift anything that is heavier than 10 lb (4.5 kg), or the limit that you are told, until your health care provider says that it is safe.  Ask your health care provider what activities are safe for you. A lot of activity during the first week after surgery can increase pain and swelling. For 1 week after your  procedure: ? Avoid activities that take a lot of effort, such as exercise or sports. ? You may walk and climb stairs as needed for daily activity, but avoid long walks or climbing stairs for exercise. Managing pain and swelling   Put ice on painful or swollen areas: ? Put ice in a plastic bag. ? Place a towel between your skin and the bag. ? Leave the ice on for 20 minutes, 2-3 times a day. General instructions  Do not take baths, swim, or use a hot tub until your health care provider approves. Ask your health care provider if you may take showers. You may only be allowed to take sponge baths.  Take over-the-counter and prescription medicines only as told by your health care provider.  To prevent or treat constipation while you are taking prescription pain medicine, your health care provider may recommend that you: ? Drink enough fluid to keep your urine pale yellow. ? Take over-the-counter or prescription medicines. ? Eat foods that are high in fiber, such as fresh fruits and vegetables, whole grains, and beans. ? Limit foods that are high in fat and processed sugars, such as fried and sweet foods.  Do not use any products that contain nicotine or tobacco, such as cigarettes and e-cigarettes. If you need help quitting, ask your health care provider.  Drink enough fluid to keep your urine pale yellow.  Keep all follow-up visits as told by your health care provider. This is important. Contact a health care provider if:    You have more redness, swelling, or pain around your incisions or your groin area.  You have more swelling in your scrotum.  You have more fluid or blood coming from your incisions.  Your incisions feel warm to the touch.  You have severe pain and medicines do not help.  You have abdominal pain or swelling.  You cannot eat or drink without vomiting.  You cannot urinate or pass a bowel movement.  You faint.  You feel dizzy.  You have nausea and  vomiting.  You have a fever. Get help right away if:  You have pus or a bad smell coming from your incisions.  You have redness, warmth, or pain in your leg.  You have chest pain.  You have problems breathing. Summary  Pain, swelling, and bruising are common after the procedure.  Check your incision area every day for signs of infection, such as more redness, swelling, or pain.  Put ice on painful or swollen areas for 20 minutes, 2-3 times a day. This information is not intended to replace advice given to you by your health care provider. Make sure you discuss any questions you have with your health care provider. Document Revised: 05/16/2019 Document Reviewed: 03/17/2017 Elsevier Patient Education  2020 [employer]   AMBULATORY SURGERY  DISCHARGE INSTRUCTIONS   1) The drugs that you were given will stay in your system until tomorrow so for the next 24 hours you should not:  A) Drive an automobile B) Make any legal decisions C) Drink any alcoholic beverage   2) You may resume regular meals tomorrow.  Today it is better to start with liquids and gradually work up to solid foods.  You may eat anything you prefer, but it is better to start with liquids, then soup and crackers, and gradually work up to solid foods.   3) Please notify your doctor immediately if you have any unusual bleeding, trouble breathing, redness and pain at the surgery site, drainage, fever, or pain not relieved by medication.    4) Additional Instructions:        Please contact your physician with any problems or Same Day Surgery at [phone], Monday through Friday 6 am to 4 pm, or [facility] [facility] number at [phone]. 

## 2020-02-28 NOTE — OR Nursing (Signed)
Discharge pending patient void and transportation to home.

## 2020-02-28 NOTE — Anesthesia Preprocedure Evaluation (Addendum)
Anesthesia Evaluation  Patient identified by MRN, date of birth, ID band Patient awake    Reviewed: Allergy & Precautions, H&P , NPO status , reviewed documented beta blocker date and time   Airway Mallampati: II  TM Distance: >3 FB Neck ROM: full    Dental  (+) Teeth Intact   Pulmonary neg pulmonary ROS, former smoker,    Pulmonary exam normal        Cardiovascular negative cardio ROS Normal cardiovascular exam  10/2019 ECHO 1. Left ventricular ejection fraction, by visual estimation, is 60 to  65%. The left ventricle has normal function. There is no left ventricular  hypertrophy.  2. Global right ventricle has normal systolic function.The right  ventricular size is normal. No increase in right ventricular wall  thickness.  3. Left atrial size was normal.  4. The aortic valve was not well visualized. Possible bicuspid aortic  valve. Aortic valve regurgitation is mild.  5. TR signal is inadequate for assessing pulmonary artery systolic  pressure.    RBBB on EKG   Neuro/Psych negative neurological ROS  negative psych ROS   GI/Hepatic negative GI ROS, Neg liver ROS, neg GERD  ,  Endo/Other  negative endocrine ROS  Renal/GU      Musculoskeletal   Abdominal   Peds  Hematology negative hematology ROS (+)   Anesthesia Other Findings Past Medical History: Chest pain 10/2019 - "stress", negative cardiac w/u  No date: History of kidney stones Past Surgical History: 07/05/2019: EXTRACORPOREAL SHOCK WAVE LITHOTRIPSY; Left     Comment:  Procedure: EXTRACORPOREAL SHOCK WAVE LITHOTRIPSY (ESWL);              Surgeon: Orson Ape, MD;  Location: [facility] ORS;                Service: Urology;  Laterality: Left;   Reproductive/Obstetrics                           Anesthesia Physical Anesthesia Plan  ASA: II  Anesthesia Plan: General ETT   Post-op Pain Management:    Induction:  Intravenous  PONV Risk Score and Plan: 2 and Ondansetron, Treatment may vary due to age or medical condition and Midazolam  Airway Management Planned: Oral ETT  Additional Equipment:   Intra-op Plan:   Post-operative Plan: Extubation in OR  Informed Consent: I have reviewed the patients History and Physical, chart, labs and discussed the procedure including the risks, benefits and alternatives for the proposed anesthesia with the patient or authorized representative who has indicated his/her understanding and acceptance.     Dental Advisory Given  Plan Discussed with: CRNA  Anesthesia Plan Comments:        Anesthesia Quick Evaluation

## 2020-02-28 NOTE — OR Nursing (Signed)
Discharge still pending void, bladder scan at 1330 = 219.  Patient taking in po's.

## 2020-02-28 NOTE — Interval H&P Note (Signed)
History and Physical Interval Note:  02/28/2020 7:17 AM  Louis Bird  has presented today for surgery, with the diagnosis of Inguinal hernia.  The various methods of treatment have been discussed with the patient and family. After consideration of risks, benefits and other options for treatment, the patient has consented to  Procedure(s): XI ROBOTIC ASSISTED Right INGUINAL HERNIA REPAIR WITH MESH possible bilateral (Bilateral) as a surgical intervention.  The patient's history has been reviewed, patient examined, no change in status, stable for surgery.  I have reviewed the patient's chart and labs.  Questions were answered to the patient's satisfaction.     Xiong Haidar F Aisia Correira

## 2020-02-28 NOTE — Op Note (Signed)
Robotic assisted Laparoscopic Transabdominal Bilateral Inguinal Hernia Repair with 3 D large Mesh       Pre-operative Diagnosis:  Right Inguinal Hernia   Post-operative Diagnosis: Bilateral Inguinal Hernias   Procedure: Robotic  Laparoscopic  repair of  Bilateral inguinal hernias   Surgeon: Sterling Big, MD FACS   Anesthesia: Gen. with endotracheal tube   Findings: Bilateral inguinal hernias, left Direct and right indirect   Procedure Details  The patient was seen again in the Holding Room. The benefits, complications, treatment options, and expected outcomes were discussed with the patient. The risks of bleeding, infection, recurrence of symptoms, failure to resolve symptoms, recurrence of hernia, ischemic orchitis, chronic pain syndrome or neuroma, were discussed again. The likelihood of improving the patient's symptoms with return to their baseline status is good.  The patient and/or family concurred with the proposed plan, giving informed consent.  The patient was taken to Operating Room, identified  and the procedure verified as Laparoscopic Inguinal Hernia Repair. Laterality confirmed.  A Time Out was held and the above information confirmed.   Prior to the induction of general anesthesia, antibiotic prophylaxis was administered. VTE prophylaxis was in place. General endotracheal anesthesia was then administered and tolerated well. After the induction, the abdomen was prepped with Chloraprep and draped in the sterile fashion. The patient was positioned in the supine position.     Supraumbilical incision created and cut down technique used to enter the abdominal cavity. Fascia elevated and incised and hasson trochar placed. Pneumoperitoneum obtained w/o HD changes. No evidence of bowel injuries.  Two 8 mm placed under direct vision. The laparoscopy revealed  Right indirect and left direct defects. I inserted the needles and the meshes. The robot was brought ot the table and docked in the  standard fashion, no collision between arms was observed. Instruments were kept under direct view at all times. We started on the right side were a flap was created. The sac was reduced and dissected free from adjacent structures. We preserved the vas and the vessels. Once dissection was completed a large 3D mesh was placed and secured with two interrupted vicryl attached to the pubic tubercle. There was good coverage of the direct, indirect and femoral spaces. The flap was closed with v lock suture.   Attention then was turned to the left side were a flap was created. The sac was reduced and dissected free from adjacent structures. We preserved the vas and the vessels. Once dissection was completed a large 3D mesh was placed and secured with two interrupted vicryl attached to the pubic tubercle. There was good coverage of the direct, indirect and femoral spaces. The flap was closed with v lock suture. Second look revealed no complications or injuries.     Once assuring that hemostasis was adequate the ports were removed and a figure-of-eight 0 Vicryl suture was placed at the fascial edges. 4-0 subcuticular Monocryl was used at all skin edges. Dermabond was placed.  Patient tolerated the procedure well. There were no complications. He was taken to the recovery room in stable condition.                 Sterling Big, MD, FACS

## 2020-03-06 NOTE — Anesthesia Postprocedure Evaluation (Signed)
Anesthesia Post Note  Patient: Louis Bird  Procedure(s) Performed: XI ROBOTIC ASSISTED BILATERAL INGUINAL HERNIA REPAIR WITH MESH (Bilateral Groin)  Patient location during evaluation: PACU Anesthesia Type: General Level of consciousness: awake and alert Pain management: pain level controlled Vital Signs Assessment: post-procedure vital signs reviewed and stable Respiratory status: spontaneous breathing, nonlabored ventilation and respiratory function stable Cardiovascular status: blood pressure returned to baseline and stable Postop Assessment: no apparent nausea or vomiting Anesthetic complications: no     Last Vitals:  Vitals:   02/28/20 1300 02/28/20 1426  BP: (!) 148/91 135/83  Pulse: 76 83  Resp: 16 18  Temp:  36.8 C  SpO2: 99% 98%    Last Pain:  Vitals:   02/29/20 1029  TempSrc:   PainSc: 3                  Mairlyn Tegtmeyer Garry Heater

## 2020-03-17 ENCOUNTER — Telehealth (INDEPENDENT_AMBULATORY_CARE_PROVIDER_SITE_OTHER): Payer: 59 | Admitting: Surgery

## 2020-03-17 ENCOUNTER — Other Ambulatory Visit: Payer: Self-pay

## 2020-03-17 DIAGNOSIS — Z09 Encounter for follow-up examination after completed treatment for conditions other than malignant neoplasm: Secondary | ICD-10-CM

## 2020-03-17 NOTE — Progress Notes (Signed)
Virtual visit in the form of phone call placed on his cell phone.  He is doing very well.  Experiences no pain.  He had a robotic inguinal hernia repair.  Had some pain in the first day or so.  He denies any bulging.  He even went for a short jogging which she tolerated well.  He went back to work without any issues.  No fevers no chills tolerating regular diet. A/P doing very well after robotic inguinal hernia repair.  No heavy lifting for total of 6 weeks.  Return to clinic prn.

## 2020-07-11 ENCOUNTER — Emergency Department: Payer: Self-pay

## 2020-07-11 ENCOUNTER — Other Ambulatory Visit: Payer: Self-pay

## 2020-07-11 ENCOUNTER — Emergency Department
Admission: EM | Admit: 2020-07-11 | Discharge: 2020-07-11 | Disposition: A | Discharge status: Home / Self Care | Payer: Self-pay | Attending: Emergency Medicine | Admitting: Emergency Medicine

## 2020-07-11 DIAGNOSIS — Z5321 Procedure and treatment not carried out due to patient leaving prior to being seen by health care provider: Secondary | ICD-10-CM | POA: Insufficient documentation

## 2020-07-11 DIAGNOSIS — R0789 Other chest pain: Secondary | ICD-10-CM | POA: Insufficient documentation

## 2020-07-11 LAB — CBC
HCT: 47.8 % (ref 39.0–52.0)
Hemoglobin: 16.1 g/dL (ref 13.0–17.0)
MCH: 29.4 pg (ref 26.0–34.0)
MCHC: 33.7 g/dL (ref 30.0–36.0)
MCV: 87.4 fL (ref 80.0–100.0)
Platelets: 249 10*3/uL (ref 150–400)
RBC: 5.47 MIL/uL (ref 4.22–5.81)
RDW: 11.6 % (ref 11.5–15.5)
WBC: 10.6 10*3/uL — ABNORMAL HIGH (ref 4.0–10.5)
nRBC: 0 % (ref 0.0–0.2)

## 2020-07-11 LAB — BASIC METABOLIC PANEL
Anion gap: 10 (ref 5–15)
BUN: 19 mg/dL (ref 6–20)
CO2: 27 mmol/L (ref 22–32)
Calcium: 10 mg/dL (ref 8.9–10.3)
Chloride: 102 mmol/L (ref 98–111)
Creatinine, Ser: 1.08 mg/dL (ref 0.61–1.24)
GFR calc Af Amer: >60 mL/min (ref >60)
GFR calc non Af Amer: >60 mL/min (ref >60)
Glucose, Bld: 101 mg/dL — ABNORMAL HIGH (ref 70–99)
Potassium: 3.8 mmol/L (ref 3.5–5.1)
Sodium: 139 mmol/L (ref 135–145)

## 2020-07-11 LAB — TROPONIN I (HIGH SENSITIVITY): Troponin I (High Sensitivity): 3 ng/L (ref <18)

## 2020-07-11 MED ORDER — SODIUM CHLORIDE 0.9% FLUSH: 3.0000 mL | Freq: Once | INTRAVENOUS | Status: DC

## 2020-07-11 NOTE — ED Triage Notes (Signed)
Pt to the er for left side chest pain x 3 weeks. Pt states it became worse today. Pt sates he has been taking tums and sometimes they help.

## 2020-07-17 ENCOUNTER — Other Ambulatory Visit: Payer: Self-pay

## 2020-07-17 ENCOUNTER — Encounter: Payer: Self-pay | Admitting: Family

## 2020-07-17 ENCOUNTER — Ambulatory Visit: Payer: BC Managed Care – PPO | Admitting: Family

## 2020-07-17 VITALS — BP 130/88 | HR 63 | Ht 69.0 in | Wt 180.0 lb

## 2020-07-17 DIAGNOSIS — R079 Chest pain, unspecified: Secondary | ICD-10-CM

## 2020-07-17 DIAGNOSIS — I4510 Unspecified right bundle-branch block: Secondary | ICD-10-CM | POA: Diagnosis not present

## 2020-07-17 DIAGNOSIS — Q231 Congenital insufficiency of aortic valve: Secondary | ICD-10-CM

## 2020-07-17 NOTE — Progress Notes (Signed)
Office Visit    Patient Name: Louis Bird Date of Encounter: 07/17/2020  Primary Care Provider:  McLean-Scocuzza, Pasty Spillers, MD Primary Cardiologist:  Debbe Odea, MD Electrophysiologist:  None   Chief Complaint    Louis Bird is a 34 y.o. male with a hx of bicuspid aortic valve with mild AI, chest pain, kidney stones presents today for chest pain  Past Medical History    Past Medical History:  Diagnosis Date  . History of kidney stones    Past Surgical History:  Procedure Laterality Date  . EXTRACORPOREAL SHOCK WAVE LITHOTRIPSY Left 07/05/2019   Procedure: EXTRACORPOREAL SHOCK WAVE LITHOTRIPSY (ESWL);  Surgeon: Orson Ape, MD;  Location: [facility] ORS;  Service: Urology;  Laterality: Left;  . XI ROBOTIC ASSISTED INGUINAL HERNIA REPAIR WITH MESH Bilateral 02/28/2020   Procedure: XI ROBOTIC ASSISTED BILATERAL INGUINAL HERNIA REPAIR WITH MESH;  Surgeon: Leafy Ro, MD;  Location: [facility] ORS;  Service: General;  Laterality: Bilateral;    Allergies  No Known Allergies  History of Present Illness    Louis Bird is a 34 y.o. male with a hx of bicuspid aortic valve with mild AI, chest pain, kidney stones. He was last seen 11/2019 by Dr Azucena Cecil.  Seen initially in consult by Dr. Azucena Cecil November 2020 for chest pain not related to exertion deemed noncardiac.  He reported history of having a hole in his heart diagnosed when he was 34 years old.  He had an echocardiogram 10/2019 with LVEF 60 to 65%, no LVH, RV normal size and function, possible bicuspid aortic valve with mild AI, normal aortic root.  No atrial or ventricular septal defect noted.  He was recommended for follow-up and repeat echo in 1 year.  March 2021 underwent laparoscopic bilateral inguinal hernia repair with mesh.  Presented to the ED 07/11/2020 with chest pain.  He left without being seen.  He noted that it did improve with Tums on occasion. HS-troponin of 3, normal renal function, electrolytes,  hemoglobin 16.1.  Chest x-ray with no active cardiopulmonary disease.  EKG shows sinus bradycardia 56 bpm with right bundle branch block and no acute ST/T wave changes. His previous EKG when seen December in clinic showed sinus bradycardia 57 bpm with incomplete right bundle branch block.  Had chest discomfort for a few days. Tells me Friday the discomfort was getting worse and he went to the ED. He was feeling better so he left. He is very worried that when he reviewed his MyChart from the preliminary workup that it shows 'abnormal EKG'.   Woke up this morning with left sided chest pain radiating to his left shoulder, back. Yesterday he did not do any strenuous activity. He did some pushups the day before but no soreness prior to today. Pain is not associated with shortness of breath. It is more sore when he takes a deep breath. He took some Ibuprofen or Tylenol (cannot recall which) - he tells me the pain got better by itself. He also used [employer] cream and it got some better. The chest pain is worse with palpation. He tells me his symptoms are actually better when he is moving and active like when he plays with his almost one year old daughter and four year old son.   EKGs/Labs/Other Studies Reviewed:   The following studies were reviewed today:  Echo 10/2019  1. Left ventricular ejection fraction, by visual estimation, is 60 to  65%. The left ventricle has normal function. There is no left ventricular  hypertrophy.   2. Global right ventricle has normal systolic function.The right  ventricular size is normal. No increase in right ventricular wall  thickness.   3. Left atrial size was normal.   4. The aortic valve was not well visualized. Possible bicuspid aortic  valve. Aortic valve regurgitation is mild.   5. TR signal is inadequate for assessing pulmonary artery systolic  pressure.   EKG:  EKG is  ordered today.  The ekg ordered today demonstrates SR 63 bpm with stable RBBB.  Recent  Labs: 10/25/2019: ALT 27; TSH 1.70 07/11/2020: BUN 19; Creatinine, Ser 1.08; Hemoglobin 16.1; Platelets 249; Potassium 3.8; Sodium 139  Recent Lipid Panel    Component Value Date/Time   CHOL 161 10/25/2019 0904   TRIG 81.0 10/25/2019 0904   HDL 37.60 (L) 10/25/2019 0904   CHOLHDL 4 10/25/2019 0904   VLDL 16.2 10/25/2019 0904   LDLCALC 107 (H) 10/25/2019 0904    Home Medications   No outpatient medications have been marked as taking for the 07/17/20 encounter (Office Visit) with Alver Sorrow, NP.    Review of Systems    Review of Systems  Constitutional: Negative for chills, fever and malaise/fatigue.  Cardiovascular: Positive for chest pain. Negative for dyspnea on exertion, leg swelling, near-syncope, orthopnea, palpitations and syncope.  Respiratory: Negative for cough, shortness of breath and wheezing.   Gastrointestinal: Negative for nausea and vomiting.  Neurological: Negative for dizziness, light-headedness and weakness.   All other systems reviewed and are otherwise negative except as noted above.  Physical Exam    VS:  BP (!) 130/88 (BP Location: Left Arm, Patient Position: Sitting, Cuff Size: Normal)   Pulse 63   Ht 5\' 9"  (1.753 m)   Wt 180 lb (81.6 kg)   SpO2 98%   BMI 26.58 kg/m  , BMI Body mass index is 26.58 kg/m. GEN: Well nourished, well developed, in no acute distress. HEENT: normal. Neck: Supple, no JVD, carotid bruits, or masses. Cardiac: RRR, no murmurs, rubs, or gallops. No clubbing, cyanosis, edema.  Radials/DP/PT 2+ and equal bilaterally.  Respiratory:  Respirations regular and unlabored, clear to auscultation bilaterally. GI: Soft, nontender, nondistended, BS + x 4. MS: No deformity or atrophy. Left chest wall tender on palpation. Skin: Warm and dry, no rash. Neuro:  Strength and sensation are intact. Psych: Normal affect.  Assessment & Plan    1. Biscuspid aortic valve with mild AI by echo 10/2019 - No lightheadedness nor shortness of  breath. He does endorse some chest pain which is likely musculoskeletal, as below. Echo 10/2019 with probably bicuspid aortic valve without stenosis. Plan for repeat echocardiogram in December as previously recommended.   2. Chest pain in adult - Recent ED visit with chest pain. Preliminary workup with negative HS troponin, non acute CXR, EKG with stable RBBB. Reports chest pain this morning on waking which was somewhat relieved with IcyHot and over the counter pain medication, and is tender on palpation. Likely etiology musculoskeletal. Atypical for angina as it is non exertional and workup has been unrevealing. He is understandable anxious, does attribute some of his symptoms to anxiety, and encouraged to discuss with PCP. We discussed coronary calcium scoring for reassurance and he would like to have this done.   3. RBBB - Stable finding on EKG. Reviewed ED EKG with him which shows continued RBBB which is stable compared to previous. No lightheadedness, dizziness, near syncope. Continue to monitor with periodic EKG.   Disposition: Follow up in December with  echocardiogram prior with Dr. Azucena Cecil as previously recommended for follow up of bicuspid aortic valve.  Alver Sorrow, NP 07/17/2020, 9:40 PM

## 2020-07-17 NOTE — Patient Instructions (Addendum)
Medication Instructions:  No medication changes today.   You may use Ibuprofen (Advil) or Acetaminophen (Tylenol) as needed for chest pain.   *If you need a refill on your cardiac medications before your next appointment, please call your pharmacy*  Lab Work: No lab work today.   Testing/Procedures:  1-  Your physician has requested that you have cardiac CT. Cardiac computed tomography (CT) is a painless test that uses an x-ray machine to take clear, detailed pictures of your heart.  We will order CT coronary calcium score $150 at our [facility] in [place]  Please call [phone] to schedule [facility] [address] [place], [place] [address]   2- ECHOCARDIOGRAM in December prior to follow up appointment - Your physician has requested that you have an echocardiogram. Echocardiography is a painless test that uses sound waves to create images of your heart. It provides your doctor with information about the size and shape of your heart and how well your heart's chambers and valves are working. This procedure takes approximately one hour. There are no restrictions for this procedure.    Your EKG today shows normal sinus rhythm. This is a normal heart rhythm. Your have a right bundle branch block - this is a slowing of one of three electrical pathways in the bottom chambers of your heart moves a bit slower than it used to. This is very common and not of concern. No changes in your EKG suggested blockages in your heart arteries.   Follow-Up: At [facility], you and your health needs are our priority.  As part of our continuing mission to provide you with exceptional heart care, we have created designated Provider Care Teams.  These Care Teams include your primary Cardiologist (physician) and Advanced Practice Providers (APPs -  Physician Assistants and Nurse Practitioners) who all work together to provide you with the care you  need, when you need it.  We recommend signing up for the patient portal called "MyChart".  Sign up information is provided on this After Visit Summary.  MyChart is used to connect with patients for Virtual Visits (Telemedicine).  Patients are able to view lab/test results, encounter notes, upcoming appointments, etc.  Non-urgent messages can be sent to your provider as well.   To learn more about what you can do with MyChart, go to [email or web address].    Your next appointment:   In December with Dr. Azucena Cecil with echocardiogram prior  Other Instructions:

## 2020-07-18 ENCOUNTER — Ambulatory Visit: Payer: Self-pay | Admitting: Cardiology

## 2020-08-06 DIAGNOSIS — Z03818 Encounter for observation for suspected exposure to other biological agents ruled out: Secondary | ICD-10-CM | POA: Diagnosis not present

## 2020-08-06 DIAGNOSIS — Z20822 Contact with and (suspected) exposure to covid-19: Secondary | ICD-10-CM | POA: Diagnosis not present

## 2020-08-15 ENCOUNTER — Other Ambulatory Visit: Payer: BC Managed Care – PPO

## 2020-08-29 IMAGING — US US EXTREM LOW*R* LIMITED
1 series · 12 of 12 positions shown · non-contrast
Comparison: CT AP 06/12/2019.

CLINICAL DATA: Rule out hernia

EXAM:
ULTRASOUND RIGHT LOWER EXTREMITY LIMITED
TECHNIQUE: Ultrasound examination of the lower extremity soft tissues was
performed in the area of clinical concern.

[Series 1: us extrem low*right* limited · 12 acquisitions, 12 frames shown]
[im 1/12]
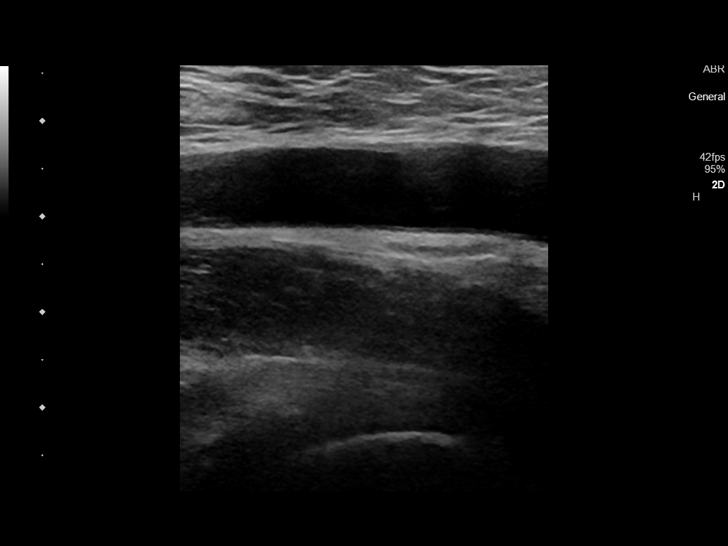
[im 2/12]
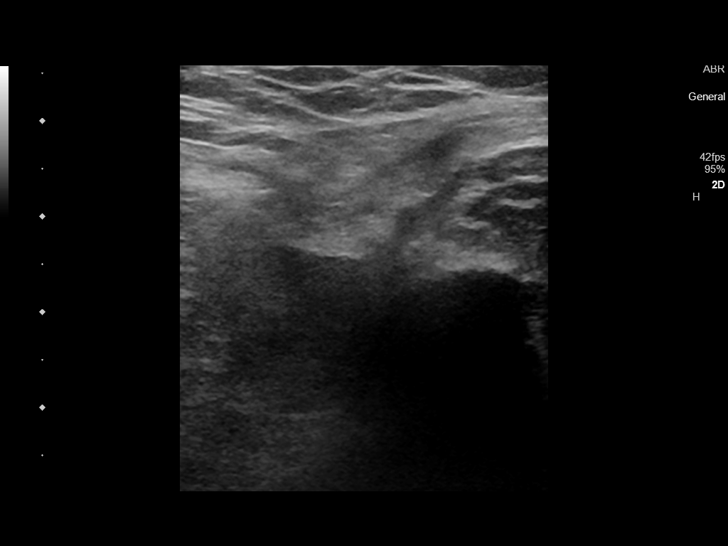
[im 3/12]
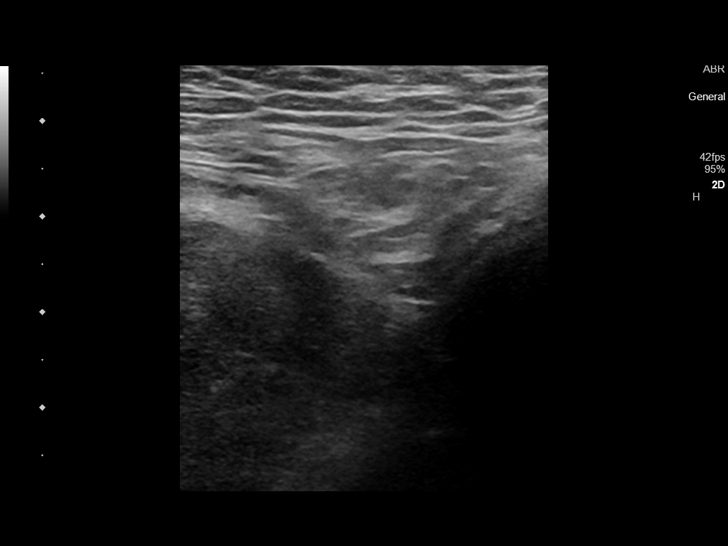
[im 4/12]
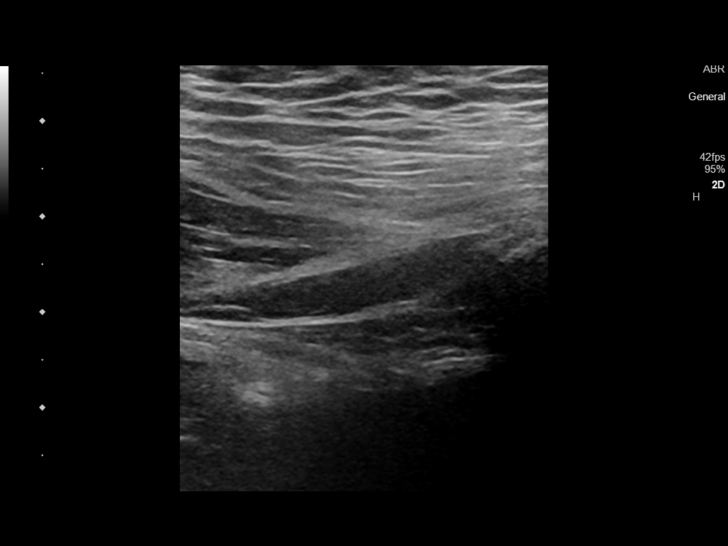
[im 5/12]
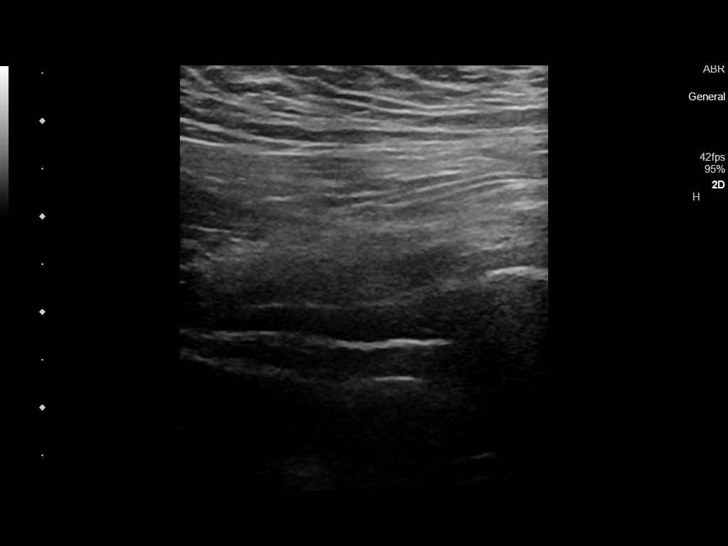
[im 6/12]
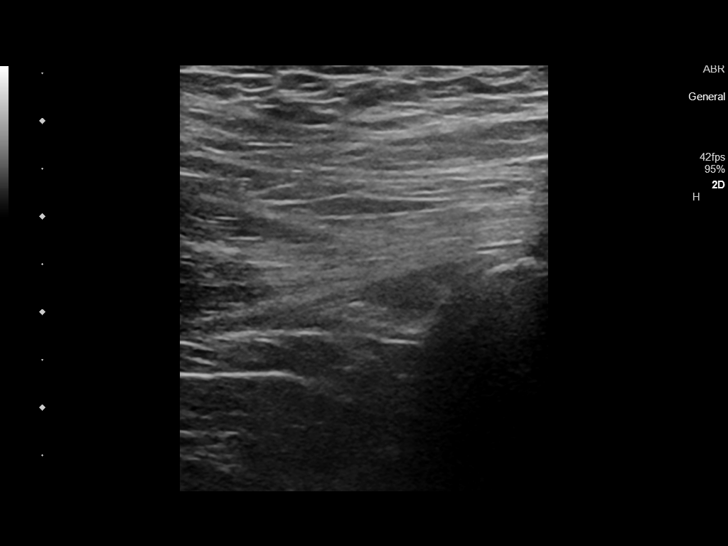
[im 7/12]
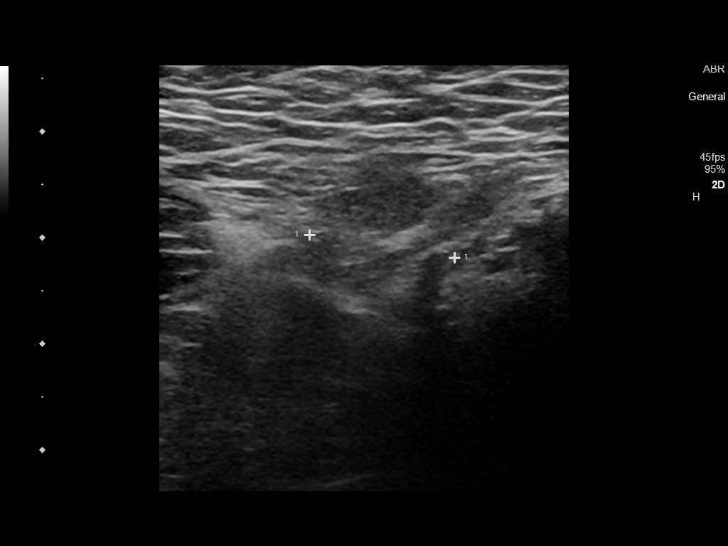
[im 8/12]
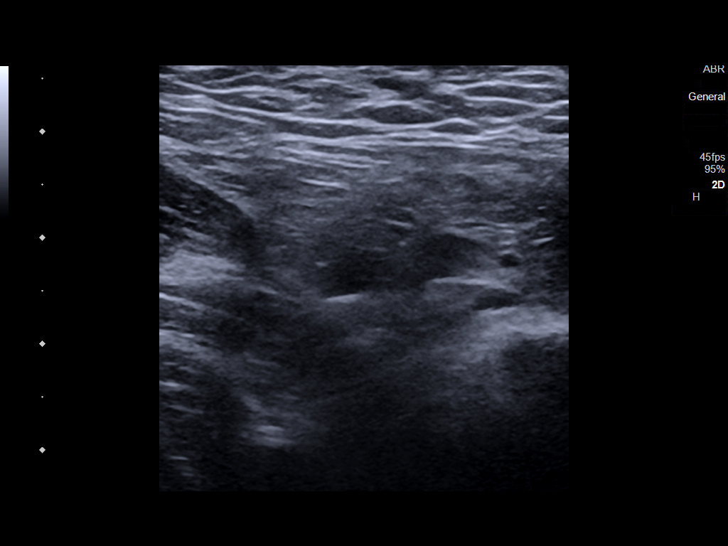
[im 9/12]
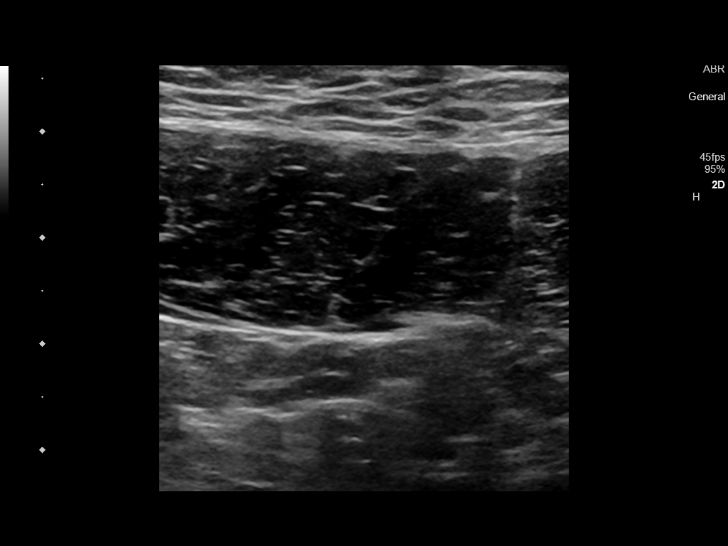
[im 10/12]
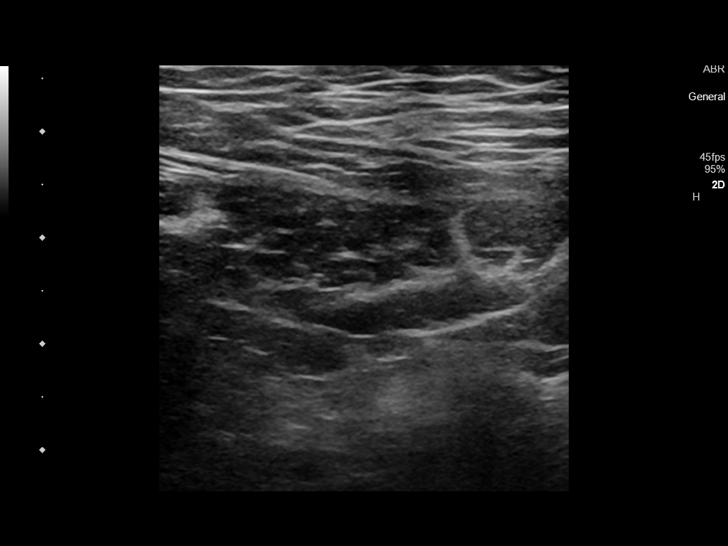
[im 11/12]
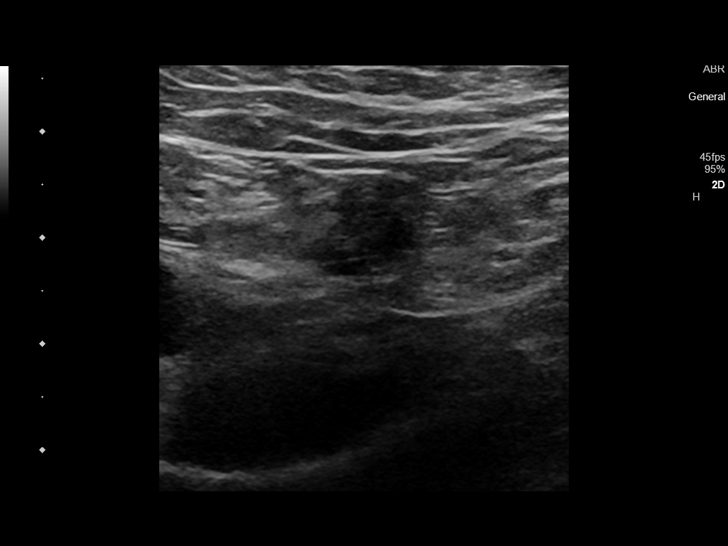
[im 12/12]
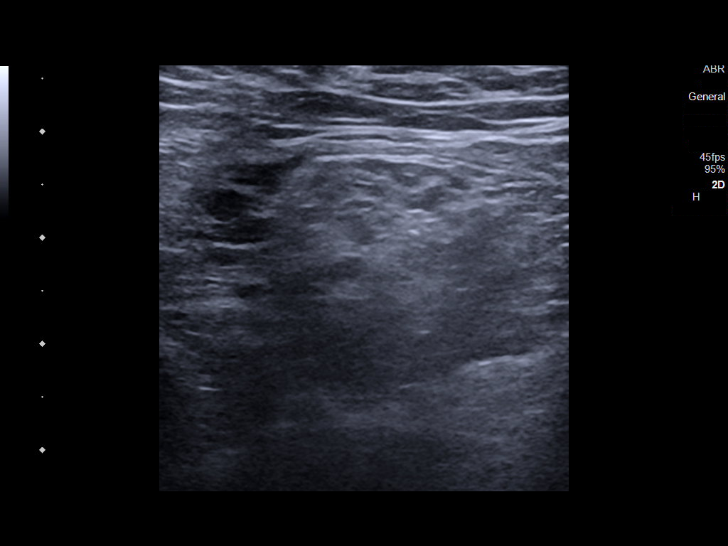

[12 of 12 positions shown; findings below may reference images not displayed]

FINDINGS: Anatomic area evaluated: Right groin.

No mass, cyst or complex cyst identified at the site of clinical
concern/palpable abnormality.

Hernia: Yes, small. Contains fat and nondilated bowel loop which is
visualized with Valsalva
IMPRESSION: 1. Exam positive for right-sided inguinal hernia with fat and non
dilated bowel loops.

## 2020-10-24 ENCOUNTER — Other Ambulatory Visit: Payer: Self-pay

## 2020-10-24 ENCOUNTER — Other Ambulatory Visit (INDEPENDENT_AMBULATORY_CARE_PROVIDER_SITE_OTHER): Payer: BC Managed Care – PPO

## 2020-10-24 DIAGNOSIS — Z1329 Encounter for screening for other suspected endocrine disorder: Secondary | ICD-10-CM

## 2020-10-24 DIAGNOSIS — Z0000 Encounter for general adult medical examination without abnormal findings: Secondary | ICD-10-CM

## 2020-10-24 DIAGNOSIS — Z1159 Encounter for screening for other viral diseases: Secondary | ICD-10-CM | POA: Diagnosis not present

## 2020-10-24 DIAGNOSIS — Z0184 Encounter for antibody response examination: Secondary | ICD-10-CM

## 2020-10-24 DIAGNOSIS — Z1389 Encounter for screening for other disorder: Secondary | ICD-10-CM | POA: Diagnosis not present

## 2020-10-24 DIAGNOSIS — Z13220 Encounter for screening for lipoid disorders: Secondary | ICD-10-CM | POA: Diagnosis not present

## 2020-10-24 LAB — CBC WITH DIFFERENTIAL/PLATELET
Basophils Absolute: 0.1 10*3/uL (ref 0.0–0.1)
Basophils Relative: 0.7 % (ref 0.0–3.0)
Eosinophils Absolute: 0.3 10*3/uL (ref 0.0–0.7)
Eosinophils Relative: 3.4 % (ref 0.0–5.0)
HCT: 46.9 % (ref 39.0–52.0)
Hemoglobin: 15.7 g/dL (ref 13.0–17.0)
Lymphocytes Relative: 28.5 % (ref 12.0–46.0)
Lymphs Abs: 2.4 10*3/uL (ref 0.7–4.0)
MCHC: 33.4 g/dL (ref 30.0–36.0)
MCV: 88.2 fl (ref 78.0–100.0)
Monocytes Absolute: 0.8 10*3/uL (ref 0.1–1.0)
Monocytes Relative: 9.7 % (ref 3.0–12.0)
Neutro Abs: 5 10*3/uL (ref 1.4–7.7)
Neutrophils Relative %: 57.7 % (ref 43.0–77.0)
Platelets: 213 10*3/uL (ref 150.0–400.0)
RBC: 5.32 Mil/uL (ref 4.22–5.81)
RDW: 12.5 % (ref 11.5–15.5)
WBC: 8.6 10*3/uL (ref 4.0–10.5)

## 2020-10-24 LAB — LIPID PANEL
Cholesterol: 151 mg/dL (ref 0–200)
HDL: 40.1 mg/dL (ref >39.00)
LDL Cholesterol: 89 mg/dL (ref 0–99)
NonHDL: 110.94
Total CHOL/HDL Ratio: 4
Triglycerides: 108 mg/dL (ref 0.0–149.0)
VLDL: 21.6 mg/dL (ref 0.0–40.0)

## 2020-10-24 LAB — COMPREHENSIVE METABOLIC PANEL
ALT: 25 U/L (ref 0–53)
AST: 21 U/L (ref 0–37)
Albumin: 4.5 g/dL (ref 3.5–5.2)
Alkaline Phosphatase: 62 U/L (ref 39–117)
BUN: 15 mg/dL (ref 6–23)
CO2: 28 mEq/L (ref 19–32)
Calcium: 9.7 mg/dL (ref 8.4–10.5)
Chloride: 100 mEq/L (ref 96–112)
Creatinine, Ser: 0.96 mg/dL (ref 0.40–1.50)
GFR: 102.55 mL/min (ref >60.00)
Glucose, Bld: 85 mg/dL (ref 70–99)
Potassium: 4.3 mEq/L (ref 3.5–5.1)
Sodium: 138 mEq/L (ref 135–145)
Total Bilirubin: 0.5 mg/dL (ref 0.2–1.2)
Total Protein: 7.9 g/dL (ref 6.0–8.3)

## 2020-10-24 LAB — TSH: TSH: 2.13 u[IU]/mL (ref 0.35–4.50)

## 2020-10-27 LAB — URINALYSIS, ROUTINE W REFLEX MICROSCOPIC
Bilirubin Urine: NEGATIVE
Glucose, UA: NEGATIVE
Hgb urine dipstick: NEGATIVE
Ketones, ur: NEGATIVE
Leukocytes,Ua: NEGATIVE
Nitrite: NEGATIVE
Protein, ur: NEGATIVE
Specific Gravity, Urine: 1.002 (ref 1.001–1.03)
pH: 6.5 (ref 5.0–8.0)

## 2020-10-27 LAB — HEPATITIS B SURFACE ANTIBODY, QUANTITATIVE: Hep B S AB Quant (Post): <5 m[IU]/mL — ABNORMAL LOW (ref >=10)

## 2020-10-31 ENCOUNTER — Ambulatory Visit (INDEPENDENT_AMBULATORY_CARE_PROVIDER_SITE_OTHER): Payer: BC Managed Care – PPO | Admitting: Internal Medicine

## 2020-10-31 ENCOUNTER — Other Ambulatory Visit: Payer: Self-pay

## 2020-10-31 ENCOUNTER — Encounter: Payer: Self-pay | Admitting: Internal Medicine

## 2020-10-31 VITALS — BP 120/88 | HR 54 | Temp 97.6°F | Ht 68.86 in | Wt 179.8 lb

## 2020-10-31 DIAGNOSIS — Z0000 Encounter for general adult medical examination without abnormal findings: Secondary | ICD-10-CM | POA: Diagnosis not present

## 2020-10-31 DIAGNOSIS — R030 Elevated blood-pressure reading, without diagnosis of hypertension: Secondary | ICD-10-CM

## 2020-10-31 DIAGNOSIS — E663 Overweight: Secondary | ICD-10-CM

## 2020-10-31 DIAGNOSIS — K5900 Constipation, unspecified: Secondary | ICD-10-CM

## 2020-10-31 DIAGNOSIS — Z23 Encounter for immunization: Secondary | ICD-10-CM | POA: Diagnosis not present

## 2020-10-31 DIAGNOSIS — M940 Chondrocostal junction syndrome [Tietze]: Secondary | ICD-10-CM

## 2020-10-31 NOTE — Progress Notes (Signed)
Chief Complaint  Patient presents with  . Annual Exam   Annual exam  1. Constipation weeks ago did not have a good stool for 2 weeks tried prunes, fruits and vegs 2. Elevated BP checked yesterday and was 128/78 3. C/o costochondritis sxs left chest esp after exercise I.e biking 1-2.5 hours  Left chest pain saw cards 06/2020 and negative thought atypical CP he thinks could have been psychological due to 35 y.o friend and 35 y.o coworker died as well and this made him nervous  4. Overweight lowest weight 130s and highest 195 biking 1-2.5 hrs outside on trails 2x per week and eats a lot of rice will try to cut back 5. S/p hernia surgery Dr. Everlene FarrierPabon doing better   Review of Systems  Constitutional: Negative for weight loss.  HENT: Negative for hearing loss.   Eyes: Negative for blurred vision.  Respiratory: Negative for shortness of breath.   Cardiovascular: Negative for chest pain.  Gastrointestinal: Negative for abdominal pain.  Musculoskeletal: Negative for falls and joint pain.  Neurological: Negative for headaches.  Psychiatric/Behavioral: Negative for depression.   Past Medical History:  Diagnosis Date  . History of kidney stones    Past Surgical History:  Procedure Laterality Date  . EXTRACORPOREAL SHOCK WAVE LITHOTRIPSY Left 07/05/2019   Procedure: EXTRACORPOREAL SHOCK WAVE LITHOTRIPSY (ESWL);  Surgeon: Orson ApeWolff, Michael R, MD;  Location: [facility];  Service: Urology;  Laterality: Left;  . XI ROBOTIC ASSISTED INGUINAL HERNIA REPAIR WITH MESH Bilateral 02/28/2020   Procedure: XI ROBOTIC ASSISTED BILATERAL INGUINAL HERNIA REPAIR WITH MESH;  Surgeon: Leafy RoPabon, Diego F, MD;  Location: [facility];  Service: General;  Laterality: Bilateral;   Family History  Problem Relation Age of Onset  . Diabetes Father    Social History   Socioeconomic History  . Marital status: Married    Spouse name: Not on file  . Number of children: Not on file  . Years of education: Not on file  . Highest  education level: Not on file  Occupational History  . Not on file  Tobacco Use  . Smoking status: Former Smoker    Quit date: 06/24/2008    Years since quitting: 12.3  . Smokeless tobacco: Never Used  . Tobacco comment: smoker x 6 years >1 ppd and quit in 2009  Vaping Use  . Vaping Use: Never used  Substance and Sexual Activity  . Alcohol use: Yes    Alcohol/week: 5.0 standard drinks    Types: 5 Cans of beer per week  . Drug use: Never  . Sexual activity: Yes  Other Topics Concern  . 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 Difficulty of Paying Living Expenses: Not on file  Food Insecurity:   . Worried About Programme researcher, broadcasting/film/videounning Out of Food in the Last Year: Not on file  . Ran Out of Food in the Last Year: Not on file  Transportation Needs:   . Lack of Transportation (Medical): Not on file  . Lack of Transportation (Non-Medical): Not on file  Physical Activity:   . Days of Exercise per Week: Not on file  . Minutes of Exercise per Session: Not on file  Stress:   . Feeling of Stress : Not on file  Social Connections:   . Frequency of Communication with Friends and Family: Not on file  . Frequency of Social Gatherings with Friends and Family: Not on file  . Attends Religious Services: Not on file  . Active Member of Clubs or Organizations: Not on file  . Attends Banker Meetings: Not on file  . Marital Status: Not on file  Intimate Partner Violence:   . Fear of Current or Ex-Partner: Not on file  . Emotionally Abused: Not on file  . Physically Abused: Not on file  . Sexually Abused: Not on file   No outpatient medications have been marked as taking for the 10/31/20 encounter (Office Visit) with McLean-Scocuzza, Pasty Spillers, MD.   No  Known Allergies Recent Results (from the past 2160 hour(s))  Hepatitis B surface antibody,quantitative     Status: Abnormal   Collection Time: 10/24/20  8:01 AM  Result Value Ref Range   Hepatitis B-Post <5 (L) > OR = 10 mIU/mL    Comment: . Patient does not have immunity to hepatitis B virus. . For additional information, please refer to http://education.[facility].com/faq/FAQ105 (This link is being provided for informational/ educational purposes only).   Urinalysis, Routine w reflex microscopic     Status: None   Collection Time: 10/24/20  8:01 AM  Result Value Ref Range   Color, Urine YELLOW YELLOW   APPearance CLEAR CLEAR   Specific Gravity, Urine 1.002 1.001 - 1.03   pH 6.5 5.0 - 8.0   Glucose, UA NEGATIVE NEGATIVE   Bilirubin Urine NEGATIVE NEGATIVE   Ketones, ur NEGATIVE NEGATIVE   Hgb urine dipstick NEGATIVE NEGATIVE   Protein, ur NEGATIVE NEGATIVE   Nitrite NEGATIVE NEGATIVE   Leukocytes,Ua NEGATIVE NEGATIVE  TSH     Status: None   Collection Time: 10/24/20  8:01 AM  Result Value Ref Range   TSH 2.13 0.35 - 4.50 uIU/mL  CBC with Differential/Platelet     Status: None   Collection Time: 10/24/20  8:01 AM  Result Value Ref Range   WBC 8.6 4.0 - 10.5 K/uL   RBC 5.32 4.22 - 5.81 Mil/uL   Hemoglobin 15.7 13.0 - 17.0 g/dL   HCT 53.6 39 - 52 %   MCV 88.2 78.0 - 100.0 fl   MCHC 33.4 30.0 - 36.0 g/dL   RDW 64.4 03.4 - 74.2 %   Platelets 213.0 150 - 400 K/uL   Neutrophils Relative % 57.7 43 - 77 %   Lymphocytes Relative 28.5 12 - 46 %   Monocytes Relative 9.7 3 - 12 %   Eosinophils Relative 3.4 0 - 5 %   Basophils Relative 0.7 0 - 3 %   Neutro Abs 5.0 1.4 - 7.7 K/uL   Lymphs Abs 2.4 0.7 - 4.0 K/uL   Monocytes Absolute 0.8 0.1 - 1.0 K/uL   Eosinophils Absolute 0.3 0.0 - 0.7 K/uL   Basophils Absolute 0.1 0.0 - 0.1 K/uL  Lipid panel     Status: None   Collection Time: 10/24/20  8:01 AM  Result Value Ref Range   Cholesterol 151 0 - 200 mg/dL    Comment: ATP  III Classification       Desirable:  < 200 mg/dL               Borderline High:  200 - 239 mg/dL          High:  > = 595 mg/dL  Triglycerides 108.0 0 - 149 mg/dL    Comment: Normal:  <884 mg/dLBorderline High:  150 - 199 mg/dL   HDL 16.60 >63.01 mg/dL   VLDL 60.1 0.0 - 09.3 mg/dL   LDL Cholesterol 89 0 - 99 mg/dL   Total CHOL/HDL Ratio 4     Comment:                Men          Women1/2 Average Risk     3.4          3.3Average Risk          5.0          4.42X Average Risk          9.6          7.13X Average Risk          15.0          11.0                       NonHDL 110.94     Comment: NOTE:  Non-HDL goal should be 30 mg/dL higher than patient's LDL goal (i.e. LDL goal of < 70 mg/dL, would have non-HDL goal of < 100 mg/dL)  Comprehensive metabolic panel     Status: None   Collection Time: 10/24/20  8:01 AM  Result Value Ref Range   Sodium 138 135 - 145 mEq/L   Potassium 4.3 3.5 - 5.1 mEq/L   Chloride 100 96 - 112 mEq/L   CO2 28 19 - 32 mEq/L   Glucose, Bld 85 70 - 99 mg/dL   BUN 15 6 - 23 mg/dL   Creatinine, Ser 2.35 0.40 - 1.50 mg/dL   Total Bilirubin 0.5 0.2 - 1.2 mg/dL   Alkaline Phosphatase 62 39 - 117 U/L   AST 21 0 - 37 U/L   ALT 25 0 - 53 U/L   Total Protein 7.9 6.0 - 8.3 g/dL   Albumin 4.5 3.5 - 5.2 g/dL   GFR 573.22 >02.54 mL/min    Comment: Calculated using the CKD-EPI Creatinine Equation (2021)   Calcium 9.7 8.4 - 10.5 mg/dL   Objective  Body mass index is 26.66 kg/m. Wt Readings from Last 3 Encounters:  10/31/20 179 lb 12.8 oz (81.6 kg)  07/17/20 180 lb (81.6 kg)  07/11/20 179 lb (81.2 kg)   Temp Readings from Last 3 Encounters:  10/31/20 97.6 F (36.4 C) (Oral)  07/11/20 98.5 F (36.9 C) (Oral)  02/28/20 98.2 F (36.8 C) (Temporal)   BP Readings from Last 3 Encounters:  10/31/20 120/88  07/17/20 (!) 130/88  07/11/20 (!) 133/87   Pulse Readings from Last 3 Encounters:  10/31/20 (!) 54  07/17/20 63  07/11/20 57    Physical Exam Vitals and  nursing note reviewed.  Constitutional:      Appearance: Normal appearance. He is well-developed, well-groomed and overweight.  HENT:     Head: Normocephalic and atraumatic.  Eyes:     Conjunctiva/sclera: Conjunctivae normal.     Pupils: Pupils are equal, round, and reactive to light.  Cardiovascular:     Rate and Rhythm: Normal rate and regular rhythm.     Heart sounds: Normal heart sounds. No murmur heard.   Pulmonary:     Effort: Pulmonary effort is normal.     Breath sounds: Normal breath sounds.  Skin:    General: Skin is warm and dry.  Neurological:  General: No focal deficit present.     Mental Status: He is alert and oriented to person, place, and time. Mental status is at baseline.     Gait: Gait normal.  Psychiatric:        Attention and Perception: Attention and perception normal.        Mood and Affect: Mood and affect normal.        Speech: Speech normal.        Behavior: Behavior normal. Behavior is cooperative.        Thought Content: Thought content normal.        Cognition and Memory: Cognition and memory normal.        Judgment: Judgment normal.     Assessment  Plan  Annual physical exam Flu shot utd Tdap per pt had 03/2017 with green card  Hep B 1/2 today check titer in future moderna 2/2  Consider hiv/hep C check in future discus with pt   Former smoker quit 2009 smoking >1ppd x 6 years rec healthy diet and exercise control stress  Eye Dr. Lew Dawes Dentist Dr. Karilyn Cota  F/u leb cards 11/2019 and prn chest pain returns  Monitor BP  Constipation, unspecified constipation type miralax prn colace prn   Elevated blood pressure reading Monitor BP at home   Costochondritis Disc otc voltaren gel   Overweight (BMI 25.0-29.9)  Healthy diet and exercise   Provider: Dr. French Ana McLean-Scocuzza-Internal Medicine

## 2020-10-31 NOTE — Patient Instructions (Addendum)
Voltaren gel 4x per day  Tylenol   Goal blood pressure <130/<80   Reduce carbs    Increase water 55-64 ounces  Miralax laxative as needed  Warm prune juice  Colace stool softer for hard stool   Debrox ear wax drops 1x/month for a week to clean ears    Constipation, Adult Constipation is when a person has fewer bowel movements in a week than normal, has difficulty having a bowel movement, or has stools that are dry, hard, or larger than normal. Constipation may be caused by an underlying condition. It may become worse with age if a person takes certain medicines and does not take in enough fluids. Follow these instructions at home: Eating and drinking   Eat foods that have a lot of fiber, such as fresh fruits and vegetables, whole grains, and beans.  Limit foods that are high in fat, low in fiber, or overly processed, such as french fries, hamburgers, cookies, candies, and soda.  Drink enough fluid to keep your urine clear or pale yellow. General instructions  Exercise regularly or as told by your health care provider.  Go to the restroom when you have the urge to go. Do not hold it in.  Take over-the-counter and prescription medicines only as told by your health care provider. These include any fiber supplements.  Practice pelvic floor retraining exercises, such as deep breathing while relaxing the lower abdomen and pelvic floor relaxation during bowel movements.  Watch your condition for any changes.  Keep all follow-up visits as told by your health care provider. This is important. Contact a health care provider if:  You have pain that gets worse.  You have a fever.  You do not have a bowel movement after 4 days.  You vomit.  You are not hungry.  You lose weight.  You are bleeding from the anus.  You have thin, pencil-like stools. Get help right away if:  You have a fever and your symptoms suddenly get worse.  You leak stool or have blood in your  stool.  Your abdomen is bloated.  You have severe pain in your abdomen.  You feel dizzy or you faint. This information is not intended to replace advice given to you by your health care provider. Make sure you discuss any questions you have with your health care provider. Document Revised: 11/18/2017 Document Reviewed: 05/26/2016 Elsevier Patient Education  2020 [employer].     Hypertension, Adult High blood pressure (hypertension) is when the force of blood pumping through the arteries is too strong. The arteries are the blood vessels that carry blood from the heart throughout the body. Hypertension forces the heart to work harder to pump blood and may cause arteries to become narrow or stiff. Untreated or uncontrolled hypertension can cause a heart attack, heart failure, a stroke, kidney disease, and other problems. A blood pressure reading consists of a higher number over a lower number. Ideally, your blood pressure should be below 120/80. The first ("top") number is called the systolic pressure. It is a measure of the pressure in your arteries as your heart beats. The second ("bottom") number is called the diastolic pressure. It is a measure of the pressure in your arteries as the heart relaxes. What are the causes? The exact cause of this condition is not known. There are some conditions that result in or are related to high blood pressure. What increases the risk? Some risk factors for high blood pressure are under your control. The  following factors may make you more likely to develop this condition:  Smoking.  Having type 2 diabetes mellitus, high cholesterol, or both.  Not getting enough exercise or physical activity.  Being overweight.  Having too much fat, sugar, calories, or salt (sodium) in your diet.  Drinking too much alcohol. Some risk factors for high blood pressure may be difficult or impossible to change. Some of these factors include:  Having chronic kidney  disease.  Having a family history of high blood pressure.  Age. Risk increases with age.  Race. You may be at higher risk if you are African American.  Gender. Men are at higher risk than women before age 35. After age 35, women are at higher risk than men.  Having obstructive sleep apnea.  Stress. What are the signs or symptoms? High blood pressure may not cause symptoms. Very high blood pressure (hypertensive crisis) may cause:  Headache.  Anxiety.  Shortness of breath.  Nosebleed.  Nausea and vomiting.  Vision changes.  Severe chest pain.  Seizures. How is this diagnosed? This condition is diagnosed by measuring your blood pressure while you are seated, with your arm resting on a flat surface, your legs uncrossed, and your feet flat on the floor. The cuff of the blood pressure monitor will be placed directly against the skin of your upper arm at the level of your heart. It should be measured at least twice using the same arm. Certain conditions can cause a difference in blood pressure between your right and left arms. Certain factors can cause blood pressure readings to be lower or higher than normal for a short period of time:  When your blood pressure is higher when you are in a health care provider's office than when you are at home, this is called white coat hypertension. Most people with this condition do not need medicines.  When your blood pressure is higher at home than when you are in a health care provider's office, this is called masked hypertension. Most people with this condition may need medicines to control blood pressure. If you have a high blood pressure reading during one visit or you have normal blood pressure with other risk factors, you may be asked to:  Return on a different day to have your blood pressure checked again.  Monitor your blood pressure at home for 1 week or longer. If you are diagnosed with hypertension, you may have other blood or  imaging tests to help your health care provider understand your overall risk for other conditions. How is this treated? This condition is treated by making healthy lifestyle changes, such as eating healthy foods, exercising more, and reducing your alcohol intake. Your health care provider may prescribe medicine if lifestyle changes are not enough to get your blood pressure under control, and if:  Your systolic blood pressure is above 130.  Your diastolic blood pressure is above 80. Your personal target blood pressure may vary depending on your medical conditions, your age, and other factors. Follow these instructions at home: Eating and drinking   Eat a diet that is high in fiber and potassium, and low in sodium, added sugar, and fat. An example eating plan is called the DASH (Dietary Approaches to Stop Hypertension) diet. To eat this way: ? Eat plenty of fresh fruits and vegetables. Try to fill one half of your plate at each meal with fruits and vegetables. ? Eat whole grains, such as whole-wheat pasta, brown rice, or whole-grain bread. Fill about one  fourth of your plate with whole grains. ? Eat or drink low-fat dairy products, such as skim milk or low-fat yogurt. ? Avoid fatty cuts of meat, processed or cured meats, and poultry with skin. Fill about one fourth of your plate with lean proteins, such as fish, chicken without skin, beans, eggs, or tofu. ? Avoid pre-made and processed foods. These tend to be higher in sodium, added sugar, and fat.  Reduce your daily sodium intake. Most people with hypertension should eat less than 1,500 mg of sodium a day.  Do not drink alcohol if: ? Your health care provider tells you not to drink. ? You are pregnant, may be pregnant, or are planning to become pregnant.  If you drink alcohol: ? Limit how much you use to:  0-1 drink a day for women.  0-2 drinks a day for men. ? Be aware of how much alcohol is in your drink. In the [place] one drink equals  one 12 oz bottle of beer (355 mL), one 5 oz glass of wine (148 mL), or one 1 oz glass of hard liquor (44 mL). Lifestyle   Work with your health care provider to maintain a healthy body weight or to lose weight. Ask what an ideal weight is for you.  Get at least 30 minutes of exercise most days of the week. Activities may include walking, swimming, or biking.  Include exercise to strengthen your muscles (resistance exercise), such as Pilates or lifting weights, as part of your weekly exercise routine. Try to do these types of exercises for 30 minutes at least 3 days a week.  Do not use any products that contain nicotine or tobacco, such as cigarettes, e-cigarettes, and chewing tobacco. If you need help quitting, ask your health care provider.  Monitor your blood pressure at home as told by your health care provider.  Keep all follow-up visits as told by your health care provider. This is important. Medicines  Take over-the-counter and prescription medicines only as told by your health care provider. Follow directions carefully. Blood pressure medicines must be taken as prescribed.  Do not skip doses of blood pressure medicine. Doing this puts you at risk for problems and can make the medicine less effective.  Ask your health care provider about side effects or reactions to medicines that you should watch for. Contact a health care provider if you:  Think you are having a reaction to a medicine you are taking.  Have headaches that keep coming back (recurring).  Feel dizzy.  Have swelling in your ankles.  Have trouble with your vision. Get help right away if you:  Develop a severe headache or confusion.  Have unusual weakness or numbness.  Feel faint.  Have severe pain in your chest or abdomen.  Vomit repeatedly.  Have trouble breathing. Summary  Hypertension is when the force of blood pumping through your arteries is too strong. If this condition is not controlled, it  may put you at risk for serious complications.  Your personal target blood pressure may vary depending on your medical conditions, your age, and other factors. For most people, a normal blood pressure is less than 120/80.  Hypertension is treated with lifestyle changes, medicines, or a combination of both. Lifestyle changes include losing weight, eating a healthy, low-sodium diet, exercising more, and limiting alcohol. This information is not intended to replace advice given to you by your health care provider. Make sure you discuss any questions you have with your health care provider.  Document Revised: 08/16/2018 Document Reviewed: 08/16/2018 Elsevier Patient Education  2020 [employer] Inc[employer]  DASH Eating Plan DASH stands for "Dietary Approaches to Stop Hypertension." The DASH eating plan is a healthy eating plan that has been shown to reduce high blood pressure (hypertension). It may also reduce your risk for type 2 diabetes, heart disease, and stroke. The DASH eating plan may also help with weight loss. What are tips for following this plan?  General guidelines  Avoid eating more than 2,300 mg (milligrams) of salt (sodium) a day. If you have hypertension, you may need to reduce your sodium intake to 1,500 mg a day.  Limit alcohol intake to no more than 1 drink a day for nonpregnant women and 2 drinks a day for men. One drink equals 12 oz of beer, 5 oz of wine, or 1 oz of hard liquor.  Work with your health care provider to maintain a healthy body weight or to lose weight. Ask what an ideal weight is for you.  Get at least 30 minutes of exercise that causes your heart to beat faster (aerobic exercise) most days of the week. Activities may include walking, swimming, or biking.  Work with your health care provider or diet and nutrition specialist (dietitian) to adjust your eating plan to your individual calorie needs. Reading food labels   Check food labels for the amount of sodium per  serving. Choose foods with less than 5 percent of the Daily Value of sodium. Generally, foods with less than 300 mg of sodium per serving fit into this eating plan.  To find whole grains, look for the word "whole" as the first word in the ingredient list. Shopping  Buy products labeled as "low-sodium" or "no salt added."  Buy fresh foods. Avoid canned foods and premade or frozen meals. Cooking  Avoid adding salt when cooking. Use salt-free seasonings or herbs instead of table salt or sea salt. Check with your health care provider or pharmacist before using salt substitutes.  Do not fry foods. Cook foods using healthy methods such as baking, boiling, grilling, and broiling instead.  Cook with heart-healthy oils, such as olive, canola, soybean, or sunflower oil. Meal planning  Eat a balanced diet that includes: ? 5 or more servings of fruits and vegetables each day. At each meal, try to fill half of your plate with fruits and vegetables. ? Up to 6-8 servings of whole grains each day. ? Less than 6 oz of lean meat, poultry, or fish each day. A 3-oz serving of meat is about the same size as a deck of cards. One egg equals 1 oz. ? 2 servings of low-fat dairy each day. ? A serving of nuts, seeds, or beans 5 times each week. ? Heart-healthy fats. Healthy fats called Omega-3 fatty acids are found in foods such as flaxseeds and coldwater fish, like sardines, salmon, and mackerel.  Limit how much you eat of the following: ? Canned or prepackaged foods. ? Food that is high in trans fat, such as fried foods. ? Food that is high in saturated fat, such as fatty meat. ? Sweets, desserts, sugary drinks, and other foods with added sugar. ? Full-fat dairy products.  Do not salt foods before eating.  Try to eat at least 2 vegetarian meals each week.  Eat more home-cooked food and less restaurant, buffet, and fast food.  When eating at a restaurant, ask that your food be prepared with less salt or  no salt, if possible. What foods are  recommended? The items listed may not be a complete list. Talk with your dietitian about what dietary choices are best for you. Grains Whole-grain or whole-wheat bread. Whole-grain or whole-wheat pasta. Brown rice. Orpah Cobb. Bulgur. Whole-grain and low-sodium cereals. Pita bread. Low-fat, low-sodium crackers. Whole-wheat flour tortillas. Vegetables Fresh or frozen vegetables (raw, steamed, roasted, or grilled). Low-sodium or reduced-sodium tomato and vegetable juice. Low-sodium or reduced-sodium tomato sauce and tomato paste. Low-sodium or reduced-sodium canned vegetables. Fruits All fresh, dried, or frozen fruit. Canned fruit in natural juice (without added sugar). Meat and other protein foods Skinless chicken or [place]. Ground chicken or [place]. Pork with fat trimmed off. Fish and seafood. Egg whites. Dried beans, peas, or lentils. Unsalted nuts, nut butters, and seeds. Unsalted canned beans. Lean cuts of beef with fat trimmed off. Low-sodium, lean deli meat. Dairy Low-fat (1%) or fat-free (skim) milk. Fat-free, low-fat, or reduced-fat cheeses. Nonfat, low-sodium ricotta or cottage cheese. Low-fat or nonfat yogurt. Low-fat, low-sodium cheese. Fats and oils Soft margarine without trans fats. Vegetable oil. Low-fat, reduced-fat, or light mayonnaise and salad dressings (reduced-sodium). Canola, safflower, olive, soybean, and sunflower oils. Avocado. Seasoning and other foods Herbs. Spices. Seasoning mixes without salt. Unsalted popcorn and pretzels. Fat-free sweets. What foods are not recommended? The items listed may not be a complete list. Talk with your dietitian about what dietary choices are best for you. Grains Baked goods made with fat, such as croissants, muffins, or some breads. Dry pasta or rice meal packs. Vegetables Creamed or fried vegetables. Vegetables in a cheese sauce. Regular canned vegetables (not low-sodium or reduced-sodium).  Regular canned tomato sauce and paste (not low-sodium or reduced-sodium). Regular tomato and vegetable juice (not low-sodium or reduced-sodium). Rosita Fire. Olives. Fruits Canned fruit in a light or heavy syrup. Fried fruit. Fruit in cream or butter sauce. Meat and other protein foods Fatty cuts of meat. Ribs. Fried meat. Tomasa Blase. Sausage. Bologna and other processed lunch meats. Salami. Fatback. Hotdogs. Bratwurst. Salted nuts and seeds. Canned beans with added salt. Canned or smoked fish. Whole eggs or egg yolks. Chicken or [place] with skin. Dairy Whole or 2% milk, cream, and half-and-half. Whole or full-fat cream cheese. Whole-fat or sweetened yogurt. Full-fat cheese. Nondairy creamers. Whipped toppings. Processed cheese and cheese spreads. Fats and oils Butter. Stick margarine. Lard. Shortening. Ghee. Bacon fat. Tropical oils, such as coconut, palm kernel, or palm oil. Seasoning and other foods Salted popcorn and pretzels. Onion salt, garlic salt, seasoned salt, table salt, and sea salt. Worcestershire sauce. Tartar sauce. Barbecue sauce. Teriyaki sauce. Soy sauce, including reduced-sodium. Steak sauce. Canned and packaged gravies. Fish sauce. Oyster sauce. Cocktail sauce. Horseradish that you find on the shelf. Ketchup. Mustard. Meat flavorings and tenderizers. Bouillon cubes. Hot sauce and Tabasco sauce. Premade or packaged marinades. Premade or packaged taco seasonings. Relishes. Regular salad dressings. Where to find more information:  National Heart, Lung, and Blood Institute: PopSteam.is  American Heart Association: www.heart.org Summary  The DASH eating plan is a healthy eating plan that has been shown to reduce high blood pressure (hypertension). It may also reduce your risk for type 2 diabetes, heart disease, and stroke.  With the DASH eating plan, you should limit salt (sodium) intake to 2,300 mg a day. If you have hypertension, you may need to reduce your sodium intake to 1,500 mg  a day.  When on the DASH eating plan, aim to eat more fresh fruits and vegetables, whole grains, lean proteins, low-fat dairy, and heart-healthy fats.  Work with your health  care provider or diet and nutrition specialist (dietitian) to adjust your eating plan to your individual calorie needs. This information is not intended to replace advice given to you by your health care provider. Make sure you discuss any questions you have with your health care provider. Document Revised: 11/18/2017 Document Reviewed: 11/29/2016 Elsevier Patient Education  2020 [employer]  Costochondritis  Costochondritis is swelling and irritation (inflammation) of the tissue (cartilage) that connects your ribs to your breastbone (sternum). This causes pain in the front of your chest. The pain usually starts gradually and involves more than one rib. What are the causes? The exact cause of this condition is not always known. It results from stress on the cartilage where your ribs attach to your sternum. The cause of this stress could be:  Chest injury (trauma).  Exercise or activity, such as lifting.  Severe coughing. What increases the risk? You may be at higher risk for this condition if you:  Are male.  Are 26?35 years old.  Recently started a new exercise or work activity.  Have low levels of vitamin D.  Have a condition that makes you cough frequently. What are the signs or symptoms? The main symptom of this condition is chest pain. The pain:  Usually starts gradually and can be sharp or dull.  Gets worse with deep breathing, coughing, or exercise.  Gets better with rest.  May be worse when you press on the sternum-rib connection (tenderness). How is this diagnosed? This condition is diagnosed based on your symptoms, medical history, and a physical exam. Your health care provider will check for tenderness when pressing on your sternum. This is the most important finding. You may also have  tests to rule out other causes of chest pain. These may include:  A chest X-ray to check for lung problems.  An electrocardiogram (ECG) to see if you have a heart problem that could be causing the pain.  An imaging scan to rule out a chest or rib fracture. How is this treated? This condition usually goes away on its own over time. Your health care provider may prescribe an NSAID to reduce pain and inflammation. Your health care provider may also suggest that you:  Rest and avoid activities that make pain worse.  Apply heat or cold to the area to reduce pain and inflammation.  Do exercises to stretch your chest muscles. If these treatments do not help, your health care provider may inject a numbing medicine at the sternum-rib connection to help relieve the pain. Follow these instructions at home:  Avoid activities that make pain worse. This includes any activities that use chest, abdominal, and side muscles.  If directed, put ice on the painful area: ? Put ice in a plastic bag. ? Place a towel between your skin and the bag. ? Leave the ice on for 20 minutes, 2-3 times a day.  If directed, apply heat to the affected area as often as told by your health care provider. Use the heat source that your health care provider recommends, such as a moist heat pack or a heating pad. ? Place a towel between your skin and the heat source. ? Leave the heat on for 20-30 minutes. ? Remove the heat if your skin turns bright red. This is especially important if you are unable to feel pain, heat, or cold. You may have a greater risk of getting burned.  Take over-the-counter and prescription medicines only as told by your health care provider.  Return to your normal activities as told by your health care provider. Ask your health care provider what activities are safe for you.  Keep all follow-up visits as told by your health care provider. This is important. Contact a health care provider if:  You  have chills or a fever.  Your pain does not go away or it gets worse.  You have a cough that does not go away (is persistent). Get help right away if:  You have shortness of breath. This information is not intended to replace advice given to you by your health care provider. Make sure you discuss any questions you have with your health care provider. Document Revised: 12/21/2017 Document Reviewed: 03/31/2016 Elsevier Patient Education  2020 [employer].

## 2020-11-26 ENCOUNTER — Ambulatory Visit (INDEPENDENT_AMBULATORY_CARE_PROVIDER_SITE_OTHER): Payer: BC Managed Care – PPO

## 2020-11-26 ENCOUNTER — Other Ambulatory Visit: Payer: Self-pay

## 2020-11-26 DIAGNOSIS — Q231 Congenital insufficiency of aortic valve: Secondary | ICD-10-CM

## 2020-11-26 LAB — ECHOCARDIOGRAM COMPLETE
AR max vel: 1.8 cm2
AV Area VTI: 1.96 cm2
AV Area mean vel: 1.95 cm2
AV Mean grad: 7 mmHg
AV Peak grad: 11.8 mmHg
Ao pk vel: 1.72 m/s
Area-P 1/2: 3.6 cm2
Calc EF: 58.1 %
P 1/2 time: 443 msec
S' Lateral: 3.3 cm
Single Plane A2C EF: 56.3 %
Single Plane A4C EF: 58.7 %

## 2020-11-27 NOTE — Telephone Encounter (Signed)
Results released to My Chart. No answer. Left message to call back.   

## 2020-11-27 NOTE — Telephone Encounter (Signed)
-----   Message from Debbe Odea, MD sent at 11/26/2020  5:32 PM EST ----- Bicuspid aortic valve, borderline enlargement of ascending aorta.  Mild aortic regurgitation.  No grossly abnormal findings to suggest any current intervention.  Will monitor patient with serial echocardiograms.  Keep follow-up appointment.

## 2020-12-02 ENCOUNTER — Other Ambulatory Visit: Payer: Self-pay

## 2020-12-02 ENCOUNTER — Ambulatory Visit (INDEPENDENT_AMBULATORY_CARE_PROVIDER_SITE_OTHER): Payer: BC Managed Care – PPO

## 2020-12-02 DIAGNOSIS — Z23 Encounter for immunization: Secondary | ICD-10-CM | POA: Diagnosis not present

## 2020-12-02 NOTE — Progress Notes (Signed)
Patient presented for HEP B injection to right deltoid, patient voiced no concerns nor showed any signs of distress during injection. 

## 2020-12-03 ENCOUNTER — Encounter: Payer: Self-pay | Admitting: Internal Medicine

## 2020-12-09 ENCOUNTER — Ambulatory Visit: Payer: BC Managed Care – PPO | Admitting: Cardiology

## 2020-12-09 ENCOUNTER — Encounter: Payer: Self-pay | Admitting: Cardiology

## 2020-12-09 ENCOUNTER — Other Ambulatory Visit: Payer: Self-pay

## 2020-12-09 VITALS — BP 120/78 | HR 54 | Ht 69.0 in | Wt 179.2 lb

## 2020-12-09 DIAGNOSIS — Z7189 Other specified counseling: Secondary | ICD-10-CM

## 2020-12-09 DIAGNOSIS — I351 Nonrheumatic aortic (valve) insufficiency: Secondary | ICD-10-CM

## 2020-12-09 DIAGNOSIS — Z136 Encounter for screening for cardiovascular disorders: Secondary | ICD-10-CM | POA: Diagnosis not present

## 2020-12-09 DIAGNOSIS — Q231 Congenital insufficiency of aortic valve: Secondary | ICD-10-CM

## 2020-12-09 NOTE — Progress Notes (Signed)
Cardiology Office Note:    Date:  12/09/2020   ID:  [name], DOB 1986-09-30, MRN [id number]  PCP:  McLean-Scocuzza, Pasty Spillers, MD  Cardiologist:  Debbe Odea, MD  Electrophysiologist:  None   Referring MD: McLean-Scocuzza, French Ana *   Chief Complaint  Patient presents with  . Other    Post echo c/o chest discomfort believes its muscular.  Meds reviewed verbally with pt.    History of Present Illness:    Louis Bird is a 34 y.o. male with no significant past medical history who presents for follow-up.  She was originally seen due to chest pain x3 weeks.  Pain was atypical and not related to exertion.  Able to ride his mountain bike for about 3 hours without any symptoms.  Also stated having a hole in his heart diagnosed when he was 34 years old.  Echocardiogram was ordered to evaluate for any defects.  Patient presents for results.   Past Medical History:  Diagnosis Date  . History of kidney stones     Past Surgical History:  Procedure Laterality Date  . EXTRACORPOREAL SHOCK WAVE LITHOTRIPSY Left 07/05/2019   Procedure: EXTRACORPOREAL SHOCK WAVE LITHOTRIPSY (ESWL);  Surgeon: Orson Ape, MD;  Location: [facility] ORS;  Service: Urology;  Laterality: Left;  . XI ROBOTIC ASSISTED INGUINAL HERNIA REPAIR WITH MESH Bilateral 02/28/2020   Procedure: XI ROBOTIC ASSISTED BILATERAL INGUINAL HERNIA REPAIR WITH MESH;  Surgeon: Leafy Ro, MD;  Location: [facility] ORS;  Service: General;  Laterality: Bilateral;    Current Medications: No outpatient medications have been marked as taking for the 12/09/20 encounter (Office Visit) with Debbe Odea, MD.     Allergies:   Patient has no known allergies.   Social History   Socioeconomic History  . Marital status: Married    Spouse name: Not on file  . Number of children: Not on file  . Years of education: Not on file  . Highest education level: Not on file  Occupational History  . Not on file  Tobacco Use  . Smoking status:  Former Smoker    Quit date: 06/24/2008    Years since quitting: 12.4  . Smokeless tobacco: Never Used  . Tobacco comment: smoker x 6 years >1 ppd and quit in 2009  Vaping Use  . Vaping Use: Never used  Substance and Sexual Activity  . Alcohol use: Yes    Alcohol/week: 5.0 standard drinks    Types: 5 Cans of beer per week  . Drug use: Never  . Sexual activity: Yes  Other Topics Concern  . Not on file  Social History Narrative   Married    2 kids boy 62 y.o and 68 month old baby as of 10/17/19   From [place] lived in [place] since 2013 has lived in [place] in [place]    Bachelors degree Public affairs consultant       Social Determinants of Corporate investment banker Strain: Not on file  Food Insecurity: Not on file  Transportation Needs: Not on file  Physical Activity: Not on file  Stress: Not on file  Social Connections: Not on file     Family History: The patient's family history includes Diabetes in his father.  ROS:   Please see the history of present illness.     All other systems reviewed and are negative.  EKGs/Labs/Other Studies Reviewed:    The following studies were reviewed today: TTE November 13, 2019   1. Left ventricular ejection fraction, by visual estimation, is  60 to 65%. The left ventricle has normal function. There is no left ventricular hypertrophy.  2. Global right ventricle has normal systolic function.The right ventricular size is normal. No increase in right ventricular wall thickness.  3. Left atrial size was normal.  4. The aortic valve was not well visualized. Possible bicuspid aortic valve. Aortic valve regurgitation is mild.  5. TR signal is inadequate for assessing pulmonary artery systolic pressure.  EKG:  EKG is  ordered today.  The ekg ordered today demonstrates sinus bradycardia, heart rate 57, incomplete right bundle branch block.  Recent Labs: 10/24/2020: ALT 25; BUN 15; Creatinine, Ser 0.96; Hemoglobin 15.7; Platelets 213.0; Potassium 4.3; Sodium  138; TSH 2.13  Recent Lipid Panel    Component Value Date/Time   CHOL 151 10/24/2020 0801   TRIG 108.0 10/24/2020 0801   HDL 40.10 10/24/2020 0801   CHOLHDL 4 10/24/2020 0801   VLDL 21.6 10/24/2020 0801   LDLCALC 89 10/24/2020 0801    Physical Exam:    VS:  BP 120/78 (BP Location: Left Arm, Patient Position: Sitting, Cuff Size: Normal)   Pulse (!) 54   Ht 5\' 9"  (1.753 m)   Wt 179 lb 4 oz (81.3 kg)   SpO2 99%   BMI 26.47 kg/m     Wt Readings from Last 3 Encounters:  12/09/20 179 lb 4 oz (81.3 kg)  10/31/20 179 lb 12.8 oz (81.6 kg)  07/17/20 180 lb (81.6 kg)     GEN:  Well nourished, well developed in no acute distress HEENT: Normal NECK: No JVD; No carotid bruits LYMPHATICS: No lymphadenopathy CARDIAC: RRR, no murmurs, rubs, gallops RESPIRATORY:  Clear to auscultation without rales, wheezing or rhonchi  ABDOMEN: Soft, non-tender, non-distended MUSCULOSKELETAL:  No edema; No deformity  SKIN: Warm and dry NEUROLOGIC:  Alert and oriented x 3 PSYCHIATRIC:  Normal affect   ASSESSMENT:    1. Bicuspid aortic valve   2. Aortic valve insufficiency, etiology of cardiac valve disease unspecified   3. Cardiac risk counseling      PLAN:    In order of problems listed above:  1. Bicuspid aortic valve, being followed serially. Repeat echocardiogram 11/2020 showed normal systolic function, EF 60 to 65%, bicuspid aortic valve, mild aortic regurgitation, ascending aorta borderline dilated at 38 mm. No significant change from previous echo.  Plan for serial monitoring with repeat echocardiogram in a year. 2. Mild aortic regurgitation.  Serial monitoring with echo as above. 3. Cardiac risk stratification, patient would like a calcium scan.  We will plan to have this scheduled.  Follow-up in 1 year.  Get echocardiogram prior to follow-up.  Total encounter time more than 35 minutes  Greater than 50% was spent in counseling and coordination of care with the patient  This note was  generated in part or whole with voice recognition software. Voice recognition is usually quite accurate but there are transcription errors that can and very often do occur. I apologize for any typographical errors that were not detected and corrected.  Medication Adjustments/Labs and Tests Ordered: Current medicines are reviewed at length with the patient today.  Concerns regarding medicines are outlined above.  No orders of the defined types were placed in this encounter.  No orders of the defined types were placed in this encounter.   There are no Patient Instructions on file for this visit.   Signed, 12/2020, MD  12/09/2020 10:13 AM    [facility]

## 2020-12-09 NOTE — Patient Instructions (Signed)
Medication Instructions:  Your physician recommends that you continue on your current medications as directed. Please refer to the Current Medication list given to you today.  *If you need a refill on your cardiac medications before your next appointment, please call your pharmacy*   Lab Work: None Ordered If you have labs (blood work) drawn today and your tests are completely normal, you will receive your results only by: Marland Kitchen MyChart Message (if you have MyChart) OR . A paper copy in the mail If you have any lab test that is abnormal or we need to change your treatment, we will call you to review the results.   Testing/Procedures:  We will order CT coronary calcium score  $99 at our [facility] in [place]  Please call  Judeth Cornfield [phone] to schedule   [facility] [address] [place], [place] [address]   Follow-Up: At [facility], you and your health needs are our priority.  As part of our continuing mission to provide you with exceptional heart care, we have created designated Provider Care Teams.  These Care Teams include your primary Cardiologist (physician) and Advanced Practice Providers (APPs -  Physician Assistants and Nurse Practitioners) who all work together to provide you with the care you need, when you need it.  We recommend signing up for the patient portal called "MyChart".  Sign up information is provided on this After Visit Summary.  MyChart is used to connect with patients for Virtual Visits (Telemedicine).  Patients are able to view lab/test results, encounter notes, upcoming appointments, etc.  Non-urgent messages can be sent to your provider as well.   To learn more about what you can do with MyChart, go to [email or web address].    Your next appointment:   1 year(s)  The format for your next appointment:   In Person  Provider:   Debbe Odea, MD   Other Instructions

## 2020-12-10 ENCOUNTER — Ambulatory Visit
Admission: RE | Admit: 2020-12-10 | Discharge: 2020-12-10 | Disposition: A | Discharge status: Home / Self Care | Payer: BC Managed Care – PPO | Source: Ambulatory Visit | Attending: Cardiology | Admitting: Cardiology

## 2020-12-10 DIAGNOSIS — Z7189 Other specified counseling: Secondary | ICD-10-CM | POA: Diagnosis not present

## 2020-12-10 DIAGNOSIS — Z136 Encounter for screening for cardiovascular disorders: Secondary | ICD-10-CM | POA: Diagnosis not present

## 2020-12-22 DIAGNOSIS — Z03818 Encounter for observation for suspected exposure to other biological agents ruled out: Secondary | ICD-10-CM | POA: Diagnosis not present

## 2020-12-22 DIAGNOSIS — Z1152 Encounter for screening for COVID-19: Secondary | ICD-10-CM | POA: Diagnosis not present

## 2020-12-27 DIAGNOSIS — Z20822 Contact with and (suspected) exposure to covid-19: Secondary | ICD-10-CM | POA: Diagnosis not present

## 2021-07-05 IMAGING — CT CT CARDIAC CORONARY ARTERY CALCIUM SCORE
3 series · 14 of 20 positions shown, 16 images · non-contrast
Comparison: 07/11/2020 chest radiograph.
COMPARISON: 07/11/2020 chest radiograph.

Addendum:
EXAM:
OVER-READ INTERPRETATION  CT CHEST

The following report is an over-read performed by radiologist Dr.
over-read does not include interpretation of cardiac or coronary
anatomy or pathology. The calcium score interpretation by the
cardiologist is attached.
CLINICAL DATA: Risk stratification
Coronary Calcium Score
TECHNIQUE: The patient was scanned on a Siemens go.Top Scanner. Axial
non-contrast 3 mm slices were carried out through the heart. The
data set was analyzed on a dedicated work station and scored using
the Agatson method.

[Series 2: sa36 calcium scoring 3.00 · axial · 0.35mm/px · z∈[-1135,-1054]mm · 4 of 45 slices shown]
[im 9/45  vessel]
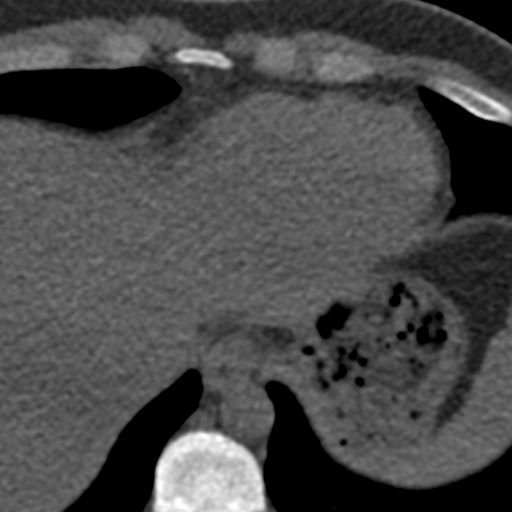
[im 18/45  vessel]
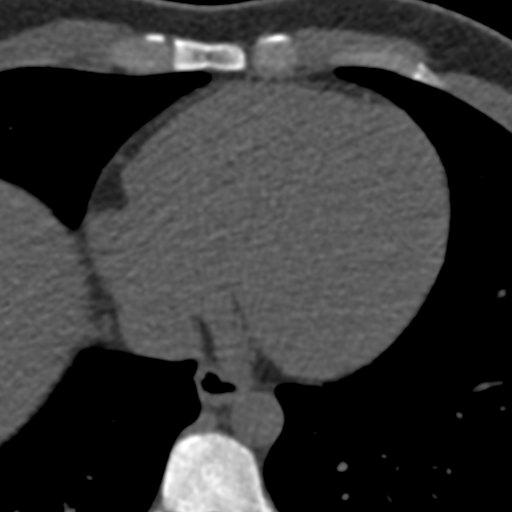
[im 27/45  vessel]
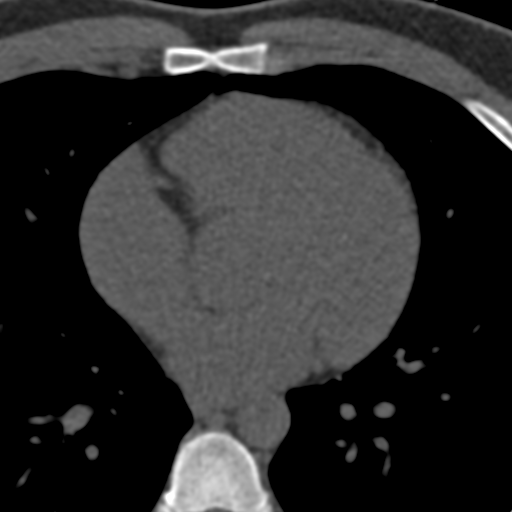
[im 36/45  vessel]
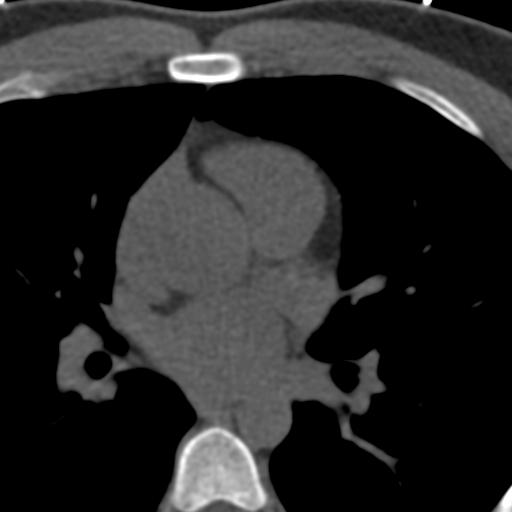

[Series 5: full fov st calcium scoring 3.00 · axial · 0.75mm/px · z∈[-1138,-1051]mm · 5 of 45 slices shown, 7 images]
[im 8/45  vessel]
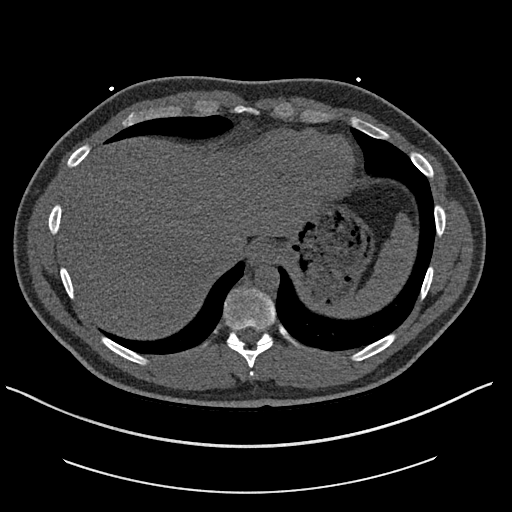
[im 8/45  lung]
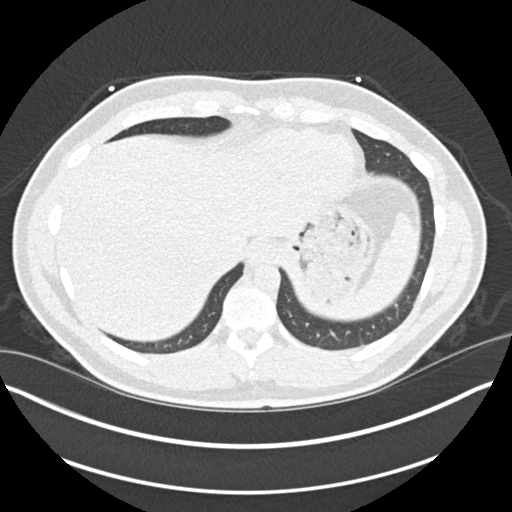
[im 15/45  vessel]
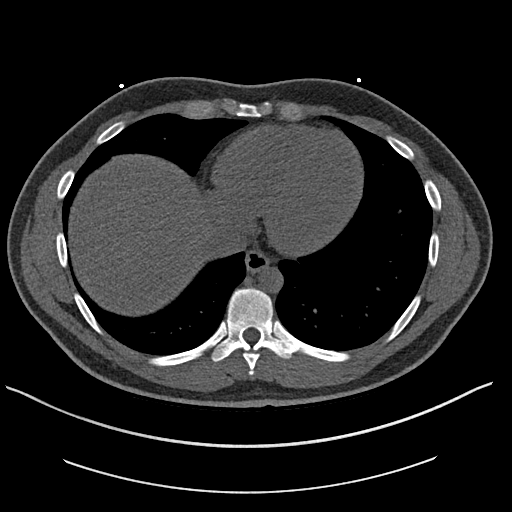
[im 23/45  vessel]
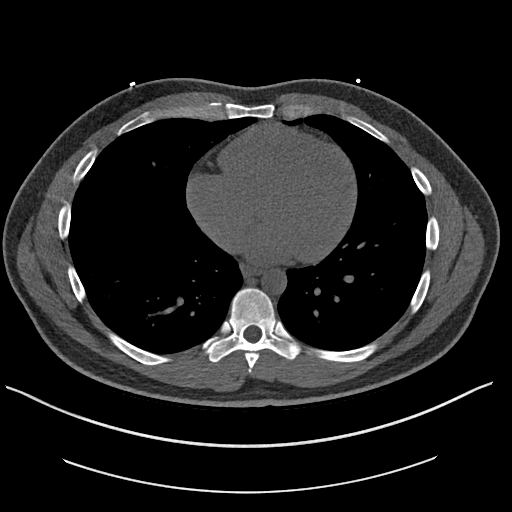
[im 30/45  vessel]
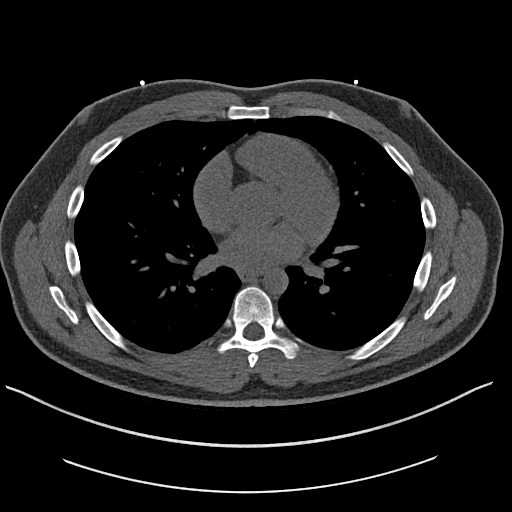
[im 37/45  vessel]
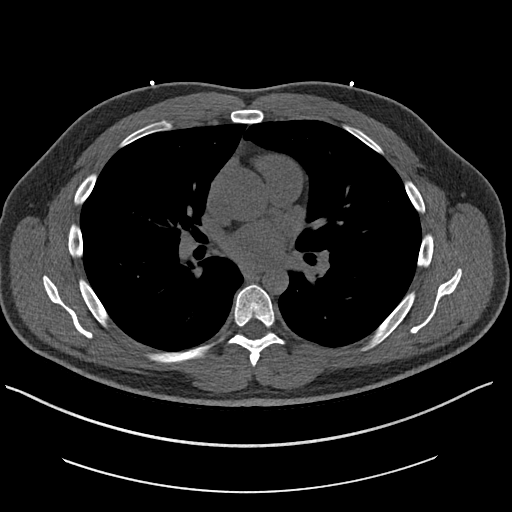
[im 37/45  lung]
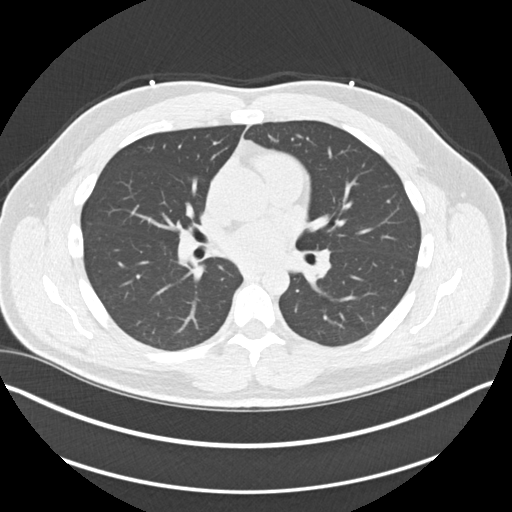

[Series 10: full fov lungs calcium scoring 3.00 ax · axial · 0.75mm/px · z∈[-1138,-1051]mm · 5 of 45 slices shown]
[im 8/45  vessel]
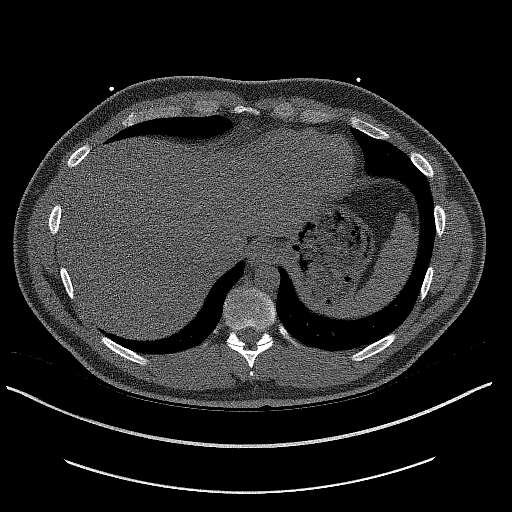
[im 15/45  vessel]
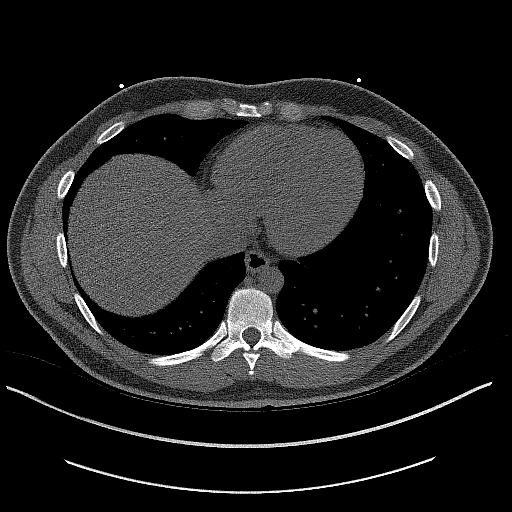
[im 23/45  vessel]
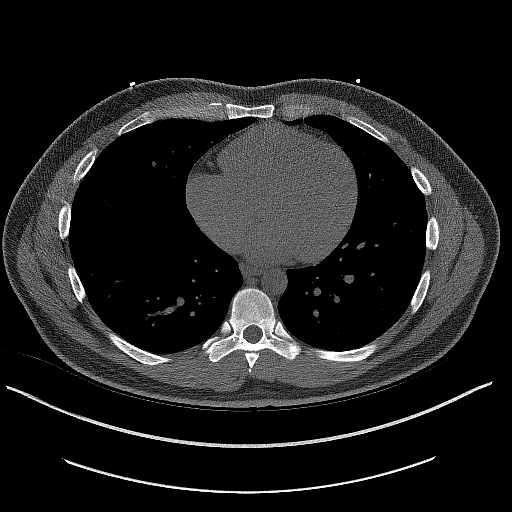
[im 30/45  vessel]
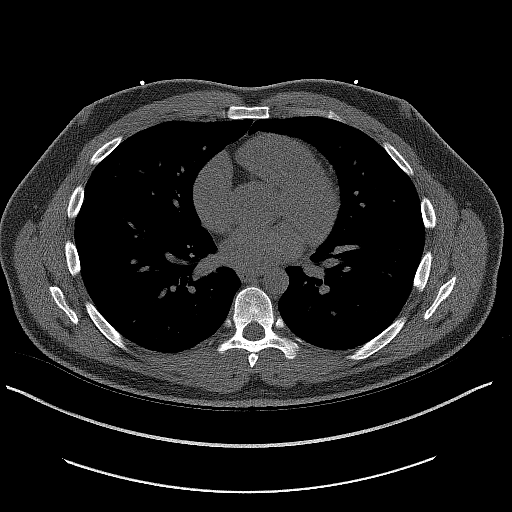
[im 37/45  vessel]
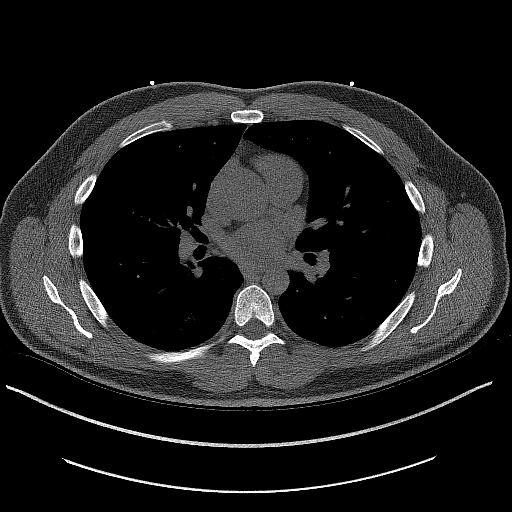

[14 of 20 positions shown; findings below may reference images not displayed]

FINDINGS: Vascular: Normal aortic caliber.

Mediastinum/Nodes: No imaged thoracic adenopathy.

Lungs/Pleura: No pleural fluid.  Clear imaged lungs.

Upper Abdomen: Normal imaged portions of the liver, spleen, stomach.

Musculoskeletal: No acute osseous abnormality.
IMPRESSION: No acute findings in the imaged extracardiac chest.
FINDINGS: Non-cardiac: See separate report from [REDACTED].

Ascending Aorta: Normal size

Pericardium: Normal

Coronary arteries: Normal origin of left and right coronary
arteries. Distribution of arterial calcifications if present, as
noted below;

LM 0

LAD 0

LCx 0

RCA 0

Total 0
IMPRESSION: Normal coronary calcium score of 0. Patient is low risk for coronary
events

Nujram Castigon

*** End of Addendum ***
EXAM:
OVER-READ INTERPRETATION  CT CHEST

The following report is an over-read performed by radiologist Dr.
over-read does not include interpretation of cardiac or coronary
anatomy or pathology. The calcium score interpretation by the
cardiologist is attached.
FINDINGS: Vascular: Normal aortic caliber.

Mediastinum/Nodes: No imaged thoracic adenopathy.

Lungs/Pleura: No pleural fluid.  Clear imaged lungs.

Upper Abdomen: Normal imaged portions of the liver, spleen, stomach.

Musculoskeletal: No acute osseous abnormality.
IMPRESSION: No acute findings in the imaged extracardiac chest.

## 2021-09-15 ENCOUNTER — Other Ambulatory Visit: Payer: Self-pay | Admitting: Family

## 2021-09-15 ENCOUNTER — Telehealth: Payer: Self-pay | Admitting: Internal Medicine

## 2021-09-15 DIAGNOSIS — Z0000 Encounter for general adult medical examination without abnormal findings: Secondary | ICD-10-CM

## 2021-09-15 NOTE — Telephone Encounter (Signed)
Patient is calling in to see if he can have labs and a urinalysis done before his appointment on 11/15.Please add the lab orders for the patient.

## 2021-09-15 NOTE — Telephone Encounter (Signed)
Please advise 

## 2021-09-16 NOTE — Telephone Encounter (Signed)
Nothing further needed. Patient already has lab appointment for 10/20/21

## 2021-09-16 NOTE — Telephone Encounter (Signed)
Called and spoke to [name]. Louis Bird scheduled for 10/20/21 for fasting labs.

## 2021-09-18 ENCOUNTER — Encounter: Payer: Self-pay | Admitting: Internal Medicine

## 2021-09-21 ENCOUNTER — Other Ambulatory Visit: Payer: Self-pay | Admitting: Family

## 2021-09-22 ENCOUNTER — Other Ambulatory Visit: Payer: Self-pay | Admitting: Family

## 2021-09-22 DIAGNOSIS — Z0000 Encounter for general adult medical examination without abnormal findings: Secondary | ICD-10-CM

## 2021-09-23 ENCOUNTER — Encounter: Payer: Self-pay | Admitting: *Deleted

## 2021-09-24 DIAGNOSIS — A048 Other specified bacterial intestinal infections: Secondary | ICD-10-CM

## 2021-09-24 HISTORY — DX: Other specified bacterial intestinal infections: A04.8

## 2021-10-20 ENCOUNTER — Other Ambulatory Visit: Payer: Self-pay

## 2021-10-20 ENCOUNTER — Other Ambulatory Visit (INDEPENDENT_AMBULATORY_CARE_PROVIDER_SITE_OTHER): Payer: BC Managed Care – PPO

## 2021-10-20 DIAGNOSIS — Z0000 Encounter for general adult medical examination without abnormal findings: Secondary | ICD-10-CM | POA: Diagnosis not present

## 2021-10-20 LAB — COMPREHENSIVE METABOLIC PANEL
ALT: 21 U/L (ref 0–53)
AST: 18 U/L (ref 0–37)
Albumin: 4.4 g/dL (ref 3.5–5.2)
Alkaline Phosphatase: 57 U/L (ref 39–117)
BUN: 17 mg/dL (ref 6–23)
CO2: 30 mEq/L (ref 19–32)
Calcium: 9.6 mg/dL (ref 8.4–10.5)
Chloride: 102 mEq/L (ref 96–112)
Creatinine, Ser: 0.88 mg/dL (ref 0.40–1.50)
GFR: 110.79 mL/min (ref >60.00)
Glucose, Bld: 89 mg/dL (ref 70–99)
Potassium: 4.2 mEq/L (ref 3.5–5.1)
Sodium: 138 mEq/L (ref 135–145)
Total Bilirubin: 0.8 mg/dL (ref 0.2–1.2)
Total Protein: 7.6 g/dL (ref 6.0–8.3)

## 2021-10-20 LAB — POCT URINALYSIS DIPSTICK
Bilirubin, UA: NEGATIVE
Blood, UA: NEGATIVE
Glucose, UA: NEGATIVE
Ketones, UA: NEGATIVE
Leukocytes, UA: NEGATIVE
Nitrite, UA: NEGATIVE
Protein, UA: NEGATIVE
Spec Grav, UA: 1.015 (ref 1.010–1.025)
Urobilinogen, UA: 0.2 E.U./dL
pH, UA: 5.5 (ref 5.0–8.0)

## 2021-10-20 LAB — CBC WITH DIFFERENTIAL/PLATELET
Basophils Absolute: 0 10*3/uL (ref 0.0–0.1)
Basophils Relative: 0.3 % (ref 0.0–3.0)
Eosinophils Absolute: 0.3 10*3/uL (ref 0.0–0.7)
Eosinophils Relative: 3.2 % (ref 0.0–5.0)
HCT: 44.7 % (ref 39.0–52.0)
Hemoglobin: 14.7 g/dL (ref 13.0–17.0)
Lymphocytes Relative: 29.1 % (ref 12.0–46.0)
Lymphs Abs: 2.5 10*3/uL (ref 0.7–4.0)
MCHC: 32.7 g/dL (ref 30.0–36.0)
MCV: 89.3 fl (ref 78.0–100.0)
Monocytes Absolute: 0.6 10*3/uL (ref 0.1–1.0)
Monocytes Relative: 6.8 % (ref 3.0–12.0)
Neutro Abs: 5.3 10*3/uL (ref 1.4–7.7)
Neutrophils Relative %: 60.6 % (ref 43.0–77.0)
Platelets: 193 10*3/uL (ref 150.0–400.0)
RBC: 5.01 Mil/uL (ref 4.22–5.81)
RDW: 13.2 % (ref 11.5–15.5)
WBC: 8.7 10*3/uL (ref 4.0–10.5)

## 2021-10-20 LAB — LIPID PANEL
Cholesterol: 137 mg/dL (ref 0–200)
HDL: 36.5 mg/dL — ABNORMAL LOW (ref >39.00)
LDL Cholesterol: 81 mg/dL (ref 0–99)
NonHDL: 100.26
Total CHOL/HDL Ratio: 4
Triglycerides: 94 mg/dL (ref 0.0–149.0)
VLDL: 18.8 mg/dL (ref 0.0–40.0)

## 2021-11-03 ENCOUNTER — Encounter: Payer: Self-pay | Admitting: Internal Medicine

## 2021-11-03 ENCOUNTER — Ambulatory Visit (INDEPENDENT_AMBULATORY_CARE_PROVIDER_SITE_OTHER): Payer: BC Managed Care – PPO | Admitting: Internal Medicine

## 2021-11-03 ENCOUNTER — Other Ambulatory Visit: Payer: Self-pay

## 2021-11-03 VITALS — BP 110/78 | HR 49 | Temp 97.6°F | Ht 68.82 in | Wt 174.6 lb

## 2021-11-03 DIAGNOSIS — R42 Dizziness and giddiness: Secondary | ICD-10-CM

## 2021-11-03 DIAGNOSIS — Z1329 Encounter for screening for other suspected endocrine disorder: Secondary | ICD-10-CM

## 2021-11-03 DIAGNOSIS — E559 Vitamin D deficiency, unspecified: Secondary | ICD-10-CM

## 2021-11-03 DIAGNOSIS — Z0000 Encounter for general adult medical examination without abnormal findings: Secondary | ICD-10-CM | POA: Diagnosis not present

## 2021-11-03 DIAGNOSIS — Z13220 Encounter for screening for lipoid disorders: Secondary | ICD-10-CM

## 2021-11-03 DIAGNOSIS — H9203 Otalgia, bilateral: Secondary | ICD-10-CM | POA: Diagnosis not present

## 2021-11-03 DIAGNOSIS — Z1389 Encounter for screening for other disorder: Secondary | ICD-10-CM | POA: Diagnosis not present

## 2021-11-03 DIAGNOSIS — A048 Other specified bacterial intestinal infections: Secondary | ICD-10-CM | POA: Insufficient documentation

## 2021-11-03 DIAGNOSIS — R001 Bradycardia, unspecified: Secondary | ICD-10-CM

## 2021-11-03 DIAGNOSIS — Z13818 Encounter for screening for other digestive system disorders: Secondary | ICD-10-CM

## 2021-11-03 DIAGNOSIS — Z0184 Encounter for antibody response examination: Secondary | ICD-10-CM

## 2021-11-03 MED ORDER — AMOXICILL-CLARITHRO-LANSOPRAZ PO MISC: Freq: Two times a day (BID) | ORAL | 0 refills | Status: DC

## 2021-11-03 MED ORDER — BISMUTH SUBSALICYLATE 262 MG/15ML PO SUSP: 30.0000 mL | Freq: Three times a day (TID) | ORAL | 3 refills | Status: DC

## 2021-11-03 NOTE — Progress Notes (Signed)
Chief Complaint  Patient presents with   Annual Exam   Annual  1. Tested + h pylori 09/24/21 wife also tested + appt GI 11/18/32 he was have GERD with exercise after eating but sxs less since does not eat around time of eating  2. C/o ear pain if cold air gets in and dizziness at times MRI IACS 2020 normal still having intermittent sx's eye exam upcoming will refer to ENT  Review of Systems  Constitutional:  Negative for weight loss.  HENT:  Negative for hearing loss.   Eyes:  Negative for blurred vision.  Respiratory:  Negative for shortness of breath.   Cardiovascular:  Negative for chest pain.  Gastrointestinal:  Positive for heartburn. Negative for abdominal pain and blood in stool.  Musculoskeletal:  Negative for back pain.  Skin:  Negative for rash.  Neurological:  Positive for dizziness. Negative for headaches.  Psychiatric/Behavioral:  Negative for depression.   Past Medical History:  Diagnosis Date   H. pylori infection 09/24/2021   History of kidney stones    Past Surgical History:  Procedure Laterality Date   EXTRACORPOREAL SHOCK WAVE LITHOTRIPSY Left 07/05/2019   Procedure: EXTRACORPOREAL SHOCK WAVE LITHOTRIPSY (ESWL);  Surgeon: Royston Cowper, MD;  Location: [facility] ORS;  Service: Urology;  Laterality: Left;   XI ROBOTIC ASSISTED INGUINAL HERNIA REPAIR WITH MESH Bilateral 02/28/2020   Procedure: XI ROBOTIC ASSISTED BILATERAL INGUINAL HERNIA REPAIR WITH MESH;  Surgeon: Jules Husbands, MD;  Location: [facility] ORS;  Service: General;  Laterality: Bilateral;   Family History  Problem Relation Age of Onset   Diabetes Father    Social History   Socioeconomic History   Marital status: Married    Spouse name: Not on file   Number of children: Not on file   Years of education: Not on file   Highest education level: Not on file  Occupational History   Not on file  Tobacco Use   Smoking status: Former    Types: Cigarettes    Quit date: 06/24/2008    Years since quitting: 13.3    Smokeless tobacco: Never   Tobacco comments:    smoker x 6 years >1 ppd and quit in 2009  Vaping Use   Vaping Use: Never used  Substance and Sexual Activity   Alcohol use: Yes    Alcohol/week: 5.0 standard drinks    Types: 5 Cans of beer per week   Drug use: Never   Sexual activity: Yes  Other Topics Concern   Not on file  Social History Narrative   Married    2 kids boy 43 y.o and 22 month old baby as of 10/17/19   From [place] lived in [place] since 2013 has lived in [place] in 2013    Bachelors degree Recruitment consultant       Social Determinants of Radio broadcast assistant Strain: Not on file  Food Insecurity: Not on file  Transportation Needs: Not on file  Physical Activity: Not on file  Stress: Not on file  Social Connections: Not on file  Intimate Partner Violence: Not on file   Current Meds  Medication Sig   amoxicillin-clarithromycin-lansoprazole (PREVPAC) combo pack Take by mouth 2 (two) times daily. Follow package directions. X 10-14 days with food   bismuth subsalicylate (PEPTO BISMOL) 262 MG/15ML suspension Take 30 mLs by mouth 4 (four) times daily -  before meals and at bedtime.   No Known Allergies Recent Results (from the past 2160 hour(s))  Lipid Panel  Status: Abnormal   Collection Time: 10/20/21  7:50 AM  Result Value Ref Range   Cholesterol 137 0 - 200 mg/dL    Comment: ATP III Classification       Desirable:  < 200 mg/dL               Borderline High:  200 - 239 mg/dL          High:  > = 240 mg/dL   Triglycerides 94.0 0.0 - 149.0 mg/dL    Comment: Normal:  <150 mg/dLBorderline High:  150 - 199 mg/dL   HDL 36.50 (L) >39.00 mg/dL   VLDL 18.8 0.0 - 40.0 mg/dL   LDL Cholesterol 81 0 - 99 mg/dL   Total CHOL/HDL Ratio 4     Comment:                Men          Women1/2 Average Risk     3.4          3.3Average Risk          5.0          4.42X Average Risk          9.6          7.13X Average Risk          15.0          11.0                        NonHDL 100.26     Comment: NOTE:  Non-HDL goal should be 30 mg/dL higher than patient's LDL goal (i.e. LDL goal of < 70 mg/dL, would have non-HDL goal of < 100 mg/dL)  CBC w/Diff     Status: None   Collection Time: 10/20/21  7:50 AM  Result Value Ref Range   WBC 8.7 4.0 - 10.5 K/uL   RBC 5.01 4.22 - 5.81 Mil/uL   Hemoglobin 14.7 13.0 - 17.0 g/dL   HCT 44.7 39.0 - 52.0 %   MCV 89.3 78.0 - 100.0 fl   MCHC 32.7 30.0 - 36.0 g/dL   RDW 13.2 11.5 - 15.5 %   Platelets 193.0 150.0 - 400.0 K/uL   Neutrophils Relative % 60.6 43.0 - 77.0 %   Lymphocytes Relative 29.1 12.0 - 46.0 %   Monocytes Relative 6.8 3.0 - 12.0 %   Eosinophils Relative 3.2 0.0 - 5.0 %   Basophils Relative 0.3 0.0 - 3.0 %   Neutro Abs 5.3 1.4 - 7.7 K/uL   Lymphs Abs 2.5 0.7 - 4.0 K/uL   Monocytes Absolute 0.6 0.1 - 1.0 K/uL   Eosinophils Absolute 0.3 0.0 - 0.7 K/uL   Basophils Absolute 0.0 0.0 - 0.1 K/uL  Comp Met (CMET)     Status: None   Collection Time: 10/20/21  7:50 AM  Result Value Ref Range   Sodium 138 135 - 145 mEq/L   Potassium 4.2 3.5 - 5.1 mEq/L   Chloride 102 96 - 112 mEq/L   CO2 30 19 - 32 mEq/L   Glucose, Bld 89 70 - 99 mg/dL   BUN 17 6 - 23 mg/dL   Creatinine, Ser 0.88 0.40 - 1.50 mg/dL   Total Bilirubin 0.8 0.2 - 1.2 mg/dL   Alkaline Phosphatase 57 39 - 117 U/L   AST 18 0 - 37 U/L   ALT 21 0 - 53 U/L   Total Protein 7.6 6.0 - 8.3  g/dL   Albumin 4.4 3.5 - 5.2 g/dL   GFR 110.79 >60.00 mL/min    Comment: Calculated using the CKD-EPI Creatinine Equation (2021)   Calcium 9.6 8.4 - 10.5 mg/dL  POC Urinalysis Dipstick     Status: Normal   Collection Time: 10/20/21  8:09 AM  Result Value Ref Range   Color, UA yellow    Clarity, UA clear    Glucose, UA Negative Negative   Bilirubin, UA negative    Ketones, UA negative    Spec Grav, UA 1.015 1.010 - 1.025   Blood, UA negative    pH, UA 5.5 5.0 - 8.0   Protein, UA Negative Negative   Urobilinogen, UA 0.2 0.2 or 1.0 E.U./dL   Nitrite, UA negative     Leukocytes, UA Negative Negative   Appearance clear    Odor no    Objective  Body mass index is 25.92 kg/m. Wt Readings from Last 3 Encounters:  11/03/21 174 lb 9.6 oz (79.2 kg)  12/09/20 179 lb 4 oz (81.3 kg)  10/31/20 179 lb 12.8 oz (81.6 kg)   Temp Readings from Last 3 Encounters:  11/03/21 97.6 F (36.4 C) (Temporal)  10/31/20 97.6 F (36.4 C) (Oral)  07/11/20 98.5 F (36.9 C) (Oral)   BP Readings from Last 3 Encounters:  11/03/21 110/78  12/09/20 120/78  10/31/20 120/88   Pulse Readings from Last 3 Encounters:  11/03/21 (!) 49  12/09/20 (!) 54  10/31/20 (!) 54    Physical Exam Vitals and nursing note reviewed.  Constitutional:      Appearance: Normal appearance. He is well-developed and well-groomed. He is obese.  HENT:     Head: Normocephalic and atraumatic.  Eyes:     Conjunctiva/sclera: Conjunctivae normal.     Pupils: Pupils are equal, round, and reactive to light.  Cardiovascular:     Rate and Rhythm: Regular rhythm. Bradycardia present.     Heart sounds: Normal heart sounds.  Pulmonary:     Effort: Pulmonary effort is normal. No respiratory distress.     Breath sounds: Normal breath sounds.  Abdominal:     Tenderness: There is no abdominal tenderness.  Musculoskeletal:     Lumbar back: Tenderness present. Negative right straight leg raise test and negative left straight leg raise test.  Skin:    General: Skin is warm and moist.  Neurological:     General: No focal deficit present.     Mental Status: He is alert and oriented to person, place, and time. Mental status is at baseline.     Sensory: Sensation is intact.     Motor: Motor function is intact.     Coordination: Coordination is intact.     Gait: Gait is intact. Gait normal.  Psychiatric:        Attention and Perception: Attention and perception normal.        Mood and Affect: Mood and affect normal.        Speech: Speech normal.        Behavior: Behavior normal. Behavior is  cooperative.        Thought Content: Thought content normal.        Cognition and Memory: Cognition and memory normal.        Judgment: Judgment normal.    Assessment  Plan  Annual physical exam - Plan: VITAMIN D 25 Hydroxy (Vit-D Deficiency, Fractures), Urinalysis, Routine w reflex microscopic, TSH, CBC with Differential/Platelet, Lipid panel, Comprehensive metabolic panel  Flu shot utd Tdap  per pt had 03/2017 with green card  Hep B 2/2 check titer next year  moderna 3/3  Consider hiv/hep C check in future discuss with pt    Former smoker quit 2009 smoking > 1ppd x 6 years  rec healthy diet and exercise control stress   Eye Dr. Kerin Ransom Dentist Dr. Marc Morgans  F/u leb cards 11/2019 and prn chest pain returns  Monitor BP  Rec healthy diet and exercise   Otalgia of both ears and dizziness in different positions- Plan: Ambulatory referral to ENT consider inner ear manuvers to see if helps  H. pylori infection - Plan: bismuth subsalicylate (PEPTO BISMOL) 262 MG/15ML suspension, amoxicillin-clarithromycin-lansoprazole (PREVPAC) combo pack Insurance will not cover pylera  F/u gi 11/18/21   Dizziness - Plan: Ambulatory referral to ENT Also with bradycardia will reach out to cards last seen 11/2020 [place]    Provider: Dr. Olivia Mackie McLean-Scocuzza-Internal Medicine

## 2021-11-03 NOTE — Patient Instructions (Addendum)
[phone] Not available [address]   [place] [address]     ENT info above Dr. McQueen/Vaught   Try a probiotic like align or culturelle while taking antibiotics  Call [facility] for therapy no referral needed  [facility] [place]  [address]  [place] [address]  [phone]  Helicobacter Pylori Infection Helicobacter pylori infection is a bacterial infection in the stomach. Long-term (chronic) infection can cause stomach irritation (gastritis), ulcers in the stomach (gastric ulcers), and ulcers in the upper part of the intestine (duodenal ulcers). Having this infection may also increase your risk of stomach cancer and a type of white blood cell cancer (lymphoma) that affects the stomach. What are the causes? This infection is caused by the Helicobacter pylori (H. pylori) bacteria. Many healthy people have this bacteria in their stomach lining. The bacteria may also spread from person to person through contact with stool (feces) or saliva. It is not known why some people develop ulcers, gastritis, or cancer from the bacteria. What increases the risk? You are more likely to develop this condition if you: Have family members with the infection. Live with many other people, such as in a dormitory. What are the signs or symptoms? Most people with this infection do not have any symptoms. If you do have symptoms, they may include: Heartburn. Stomach pain. Nausea. Vomiting. The vomit may be bloody because of ulcers. Loss of appetite. Bad breath. How is this diagnosed? This condition may be diagnosed based on: Your symptoms and medical history. A physical exam. Blood tests. Stool tests. A breath test. A procedure that involves placing a tube with a camera on the end of it down your throat to examine your stomach and upper intestine (upper endoscopy). Removing and testing a tissue sample from  the stomach lining (biopsy). A biopsy may be taken during an upper endoscopy. How is this treated? This condition is treated by taking a combination of medicines (triple therapy) for several weeks. Triple therapy includes one medicine to reduce the amount of acid in your stomach and two types of antibiotic medicines. This treatment may reduce your risk of cancer. You may need to be tested for H. pylori again after treatment. In some cases, the treatment may need to be repeated if your treatment did not get rid of all the bacteria. Follow these instructions at home:  Take over-the-counter and prescription medicines only as told by your health care provider. Take your antibiotic medicine as told by your health care provider. Do not stop taking the antibiotics even if you start to feel better. Return to your normal activities as told by your health care provider. Ask your health care provider what activities are safe for you. Take steps to prevent future infections: Wash your hands often with soap and water for at least 20 seconds. If soap and water are not available, use hand sanitizer. Do not eat food or drink water that may have had contact with stool or saliva. Keep all follow-up visits. This is important. You may need tests to make sure your treatment worked. Contact a health care provider if your symptoms: Do not get better with treatment. Return after treatment. Summary Helicobacter pylori infection is a stomach infection caused by the Helicobacter pylori (H. pylori) bacteria. This infection can cause stomach irritation (gastritis), ulcers in the stomach (gastric ulcers), and ulcers in the upper part of the intestine (duodenal ulcers). This condition is treated by taking  a combination of medicines (triple therapy) for several weeks. Take your antibiotic medicine as told by your health care provider. Do not stop taking the antibiotics even if you start to feel better. This information is not  intended to replace advice given to you by your health care provider. Make sure you discuss any questions you have with your health care provider. Document Revised: 06/24/2021 Document Reviewed: 06/24/2021 Elsevier Patient Education  2022 [employer]  Dizziness Dizziness is a common problem. It is a feeling of unsteadiness or light-headedness. You may feel like you are about to faint. Dizziness can lead to injury if you stumble or fall. Anyone can become dizzy, but dizziness is more common in older adults. This condition can be caused by a number of things, including medicines, dehydration, or illness. Follow these instructions at home: Eating and drinking  Drink enough fluid to keep your urine pale yellow. This helps to keep you from becoming dehydrated. Try to drink more clear fluids, such as water. Do not drink alcohol. Limit your caffeine intake if told to do so by your health care provider. Check ingredients and nutrition facts to see if a food or beverage contains caffeine. Limit your salt (sodium) intake if told to do so by your health care provider. Check ingredients and nutrition facts to see if a food or beverage contains sodium. Activity  Avoid making quick movements. Rise slowly from chairs and steady yourself until you feel okay. In the morning, first sit up on the side of the bed. When you feel okay, stand slowly while you hold onto something until you know that your balance is good. If you need to stand in one place for a long time, move your legs often. Tighten and relax the muscles in your legs while you are standing. Do not drive or use machinery if you feel dizzy. Avoid bending down if you feel dizzy. Place items in your home so that they are easy for you to reach without leaning over. Lifestyle Do not use any products that contain nicotine or tobacco. These products include cigarettes, chewing tobacco, and vaping devices, such as e-cigarettes. If you need help quitting,  ask your health care provider. Try to reduce your stress level by using methods such as yoga or meditation. Talk with your health care provider if you need help to manage your stress. General instructions Watch your dizziness for any changes. Take over-the-counter and prescription medicines only as told by your health care provider. Talk with your health care provider if you think that your dizziness is caused by a medicine that you are taking. Tell a friend or a family member that you are feeling dizzy. If he or she notices any changes in your behavior, have this person call your health care provider. Keep all follow-up visits. This is important. Contact a health care provider if: Your dizziness does not go away or you have new symptoms. Your dizziness or light-headedness gets worse. You feel nauseous. You have reduced hearing. You have a fever. You have neck pain or a stiff neck. Your dizziness leads to an injury or a fall. Get help right away if: You vomit or have diarrhea and are unable to eat or drink anything. You have problems talking, walking, swallowing, or using your arms, hands, or legs. You feel generally weak. You have any bleeding. You are not thinking clearly or you have trouble forming sentences. It may take a friend or family member to notice this. You have chest pain,  abdominal pain, shortness of breath, or sweating. Your vision changes or you develop a severe headache. These symptoms may represent a serious problem that is an emergency. Do not wait to see if the symptoms will go away. Get medical help right away. Call your local emergency services (911 in the [place] Do not drive yourself to the hospital. Summary Dizziness is a feeling of unsteadiness or light-headedness. This condition can be caused by a number of things, including medicines, dehydration, or illness. Anyone can become dizzy, but dizziness is more common in older adults. Drink enough fluid to keep your  urine pale yellow. Do not drink alcohol. Avoid making quick movements if you feel dizzy. Monitor your dizziness for any changes. This information is not intended to replace advice given to you by your health care provider. Make sure you discuss any questions you have with your health care provider. Document Revised: 11/10/2020 Document Reviewed: 11/10/2020 Elsevier Patient Education  2022 Elsevier Inc.  Managing Anxiety, Adult After being diagnosed with anxiety, you may be relieved to know why you have felt or behaved a certain way. You may also feel overwhelmed about the treatment ahead and what it will mean for your life. With care and support, you can manage this condition. How to manage lifestyle changes Managing stress and anxiety Stress is your body's reaction to life changes and events, both good and bad. Most stress will last just a few hours, but stress can be ongoing and can lead to more than just stress. Although stress can play a major role in anxiety, it is not the same as anxiety. Stress is usually caused by something external, such as a deadline, test, or competition. Stress normally passes after the triggering event has ended.  Anxiety is caused by something internal, such as imagining a terrible outcome or worrying that something will go wrong that will devastate you. Anxiety often does not go away even after the triggering event is over, and it can become long-term (chronic) worry. It is important to understand the differences between stress and anxiety and to manage your stress effectively so that it does not lead to an anxious response. Talk with your health care provider or a counselor to learn more about reducing anxiety and stress. He or she may suggest tension reduction techniques, such as: Music therapy. Spend time creating or listening to music that you enjoy and that inspires you. Mindfulness-based meditation. Practice being aware of your normal breaths while not trying to  control your breathing. It can be done while sitting or walking. Centering prayer. This involves focusing on a word, phrase, or sacred image that means something to you and brings you peace. Deep breathing. To do this, expand your stomach and inhale slowly through your nose. Hold your breath for 3-5 seconds. Then exhale slowly, letting your stomach muscles relax. Self-talk. Learn to notice and identify thought patterns that lead to anxiety reactions and change those patterns to thoughts that feel peaceful. Muscle relaxation. Taking time to tense muscles and then relax them. Choose a tension reduction technique that fits your lifestyle and personality. These techniques take time and practice. Set aside 5-15 minutes a day to do them. Therapists can offer counseling and training in these techniques. The training to help with anxiety may be covered by some insurance plans. Other things you can do to manage stress and anxiety include: Keeping a stress diary. This can help you learn what triggers your reaction and then learn ways to manage your response. Thinking  about how you react to certain situations. You may not be able to control everything, but you can control your response. Making time for activities that help you relax and not feeling guilty about spending your time in this way. Doing visual imagery. This involves imagining or creating mental pictures to help you relax. Practicing yoga. Through yoga poses, you can lower tension and promote relaxation.  Medicines Medicines can help ease symptoms. Medicines for anxiety include: Antidepressant medicines. These are usually prescribed for long-term daily control. Anti-anxiety medicines. These may be added in severe cases, especially when panic attacks occur. Medicines will be prescribed by a health care provider. When used together, medicines, psychotherapy, and tension reduction techniques may be the most effective  treatment. Relationships Relationships can play a big part in helping you recover. Try to spend more time connecting with trusted friends and family members. Consider going to couples counseling if you have a partner, taking family education classes, or going to family therapy. Therapy can help you and others better understand your condition. How to recognize changes in your anxiety Everyone responds differently to treatment for anxiety. Recovery from anxiety happens when symptoms decrease and stop interfering with your daily activities at home or work. This may mean that you will start to: Have better concentration and focus. Worry will interfere less in your daily thinking. Sleep better. Be less irritable. Have more energy. Have improved memory. It is also important to recognize when your condition is getting worse. Contact your health care provider if your symptoms interfere with home or work and you feel like your condition is not improving. Follow these instructions at home: Activity Exercise. Adults should do the following: Exercise for at least 150 minutes each week. The exercise should increase your heart rate and make you sweat (moderate-intensity exercise). Strengthening exercises at least twice a week. Get the right amount and quality of sleep. Most adults need 7-9 hours of sleep each night. Lifestyle  Eat a healthy diet that includes plenty of vegetables, fruits, whole grains, low-fat dairy products, and lean protein. Do not eat a lot of foods that are high in fats, added sugars, or salt (sodium). Make choices that simplify your life. Do not use any products that contain nicotine or tobacco. These products include cigarettes, chewing tobacco, and vaping devices, such as e-cigarettes. If you need help quitting, ask your health care provider. Avoid caffeine, alcohol, and certain over-the-counter cold medicines. These may make you feel worse. Ask your pharmacist which medicines to  avoid. General instructions Take over-the-counter and prescription medicines only as told by your health care provider. Keep all follow-up visits. This is important. Where to find support You can get help and support from these sources: Self-help groups. Online and [employer]. A trusted spiritual leader. Couples counseling. Family education classes. Family therapy. Where to find more information You may find that joining a support group helps you deal with your anxiety. The following sources can help you locate counselors or support groups near you: Mental Health America: www.mentalhealthamerica.net Anxiety and Depression [employer] of [place] (ADAA): [employer] Mental Illness (NAMI): www.nami.org Contact a health care provider if: You have a hard time staying focused or finishing daily tasks. You spend many hours a day feeling worried about everyday life. You become exhausted by worry. You start to have headaches or frequently feel tense. You develop chronic nausea or diarrhea. Get help right away if: You have a racing heart and shortness of breath. You have thoughts of hurting  yourself or others. If you ever feel like you may hurt yourself or others, or have thoughts about taking your own life, get help right away. Go to your nearest emergency department or: Call your local emergency services (911 in the [place] Call a suicide crisis helpline, such as the National Suicide Prevention Lifeline at 626-556-5172 or 988 in the [place] This is open 24 hours a day in the [place] Text the Crisis Text Line at 618 336 2711 (in the [place] Summary Taking steps to learn and use tension reduction techniques can help calm you and help prevent triggering an anxiety reaction. When used together, medicines, psychotherapy, and tension reduction techniques may be the most effective treatment. Family, friends, and partners can play a big part in supporting you. This  information is not intended to replace advice given to you by your health care provider. Make sure you discuss any questions you have with your health care provider. Document Revised: 07/01/2021 Document Reviewed: 03/29/2021 Elsevier Patient Education  2022 [employer].

## 2021-11-18 ENCOUNTER — Ambulatory Visit: Payer: BC Managed Care – PPO | Admitting: Gastroenterology

## 2021-11-18 ENCOUNTER — Other Ambulatory Visit: Payer: Self-pay | Admitting: Cardiology

## 2021-11-18 DIAGNOSIS — I359 Nonrheumatic aortic valve disorder, unspecified: Secondary | ICD-10-CM

## 2021-11-24 ENCOUNTER — Encounter: Payer: Self-pay | Admitting: Internal Medicine

## 2021-11-24 ENCOUNTER — Other Ambulatory Visit: Payer: Self-pay

## 2021-11-24 ENCOUNTER — Ambulatory Visit (INDEPENDENT_AMBULATORY_CARE_PROVIDER_SITE_OTHER): Payer: BC Managed Care – PPO

## 2021-11-24 DIAGNOSIS — I359 Nonrheumatic aortic valve disorder, unspecified: Secondary | ICD-10-CM

## 2021-11-24 LAB — ECHOCARDIOGRAM COMPLETE
Area-P 1/2: 4.21 cm2
Calc EF: 52.1 %
S' Lateral: 3.2 cm
Single Plane A2C EF: 52.5 %
Single Plane A4C EF: 52.9 %

## 2021-11-25 ENCOUNTER — Other Ambulatory Visit: Payer: Self-pay | Admitting: Internal Medicine

## 2021-11-25 DIAGNOSIS — A048 Other specified bacterial intestinal infections: Secondary | ICD-10-CM

## 2021-11-25 MED ORDER — PANTOPRAZOLE SODIUM 40 MG PO TBEC: 40.0000 mg | DELAYED_RELEASE_TABLET | Freq: Two times a day (BID) | ORAL | 0 refills | Status: DC

## 2021-11-25 MED ORDER — CLARITHROMYCIN 500 MG PO TABS: 500.0000 mg | ORAL_TABLET | Freq: Two times a day (BID) | ORAL | 0 refills | Status: AC

## 2021-11-25 MED ORDER — METRONIDAZOLE 500 MG PO TABS: 500.0000 mg | ORAL_TABLET | Freq: Three times a day (TID) | ORAL | 0 refills | Status: DC

## 2021-11-25 MED ORDER — METRONIDAZOLE 500 MG PO TABS: 500.0000 mg | ORAL_TABLET | Freq: Three times a day (TID) | ORAL | 0 refills | Status: AC

## 2021-11-25 MED ORDER — PEPTO-BISMOL 524 MG/30ML PO SUSP: 30.0000 mL | Freq: Three times a day (TID) | ORAL | 2 refills | Status: DC

## 2021-11-25 MED ORDER — BISMUTH SUBSALICYLATE 262 MG/15ML PO SUSP: 30.0000 mL | Freq: Three times a day (TID) | ORAL | 3 refills | Status: DC

## 2021-11-25 NOTE — Telephone Encounter (Signed)
Patient states the amoxicillin-clarithromycin-lansoprazole [facility]) combo pack is currently not available at the pharmacy.   Please advise on changing medication

## 2021-11-25 NOTE — Addendum Note (Signed)
Addended by: Quentin Ore on: 11/25/2021 06:26 PM   Modules accepted: Orders

## 2021-11-27 ENCOUNTER — Telehealth: Payer: Self-pay | Admitting: Internal Medicine

## 2021-11-27 ENCOUNTER — Other Ambulatory Visit: Payer: Self-pay | Admitting: Internal Medicine

## 2021-11-27 DIAGNOSIS — A048 Other specified bacterial intestinal infections: Secondary | ICD-10-CM

## 2021-11-27 MED ORDER — OMEPRAZOLE 40 MG PO CPDR: 40.0000 mg | DELAYED_RELEASE_CAPSULE | Freq: Every day | ORAL | 0 refills | Status: DC

## 2021-11-27 NOTE — Telephone Encounter (Signed)
Advise to pick up prilosec 40 mg 2x per day [facility] [place]  Insurance will not cover protonix  With other 3 meds for H pylori  Thanks

## 2021-12-01 ENCOUNTER — Other Ambulatory Visit: Payer: Self-pay

## 2021-12-01 ENCOUNTER — Ambulatory Visit: Payer: BC Managed Care – PPO | Admitting: Cardiology

## 2021-12-01 ENCOUNTER — Encounter: Payer: Self-pay | Admitting: Cardiology

## 2021-12-01 VITALS — BP 130/80 | HR 57 | Ht 69.0 in | Wt 177.0 lb

## 2021-12-01 DIAGNOSIS — I351 Nonrheumatic aortic (valve) insufficiency: Secondary | ICD-10-CM

## 2021-12-01 DIAGNOSIS — Q231 Congenital insufficiency of aortic valve: Secondary | ICD-10-CM | POA: Diagnosis not present

## 2021-12-01 NOTE — Progress Notes (Signed)
Cardiology Office Note:    Date:  12/01/2021   ID:  Louis Bird, DOB 02-07-1986, MRN [id number]  PCP:  Louis Bird, Louis Spillers, MD  Cardiologist:  Louis Odea, MD  Electrophysiologist:  None   Referring MD: Louis Bird, French Ana *   Chief Complaint  Patient presents with   Other    12 month follow up -- Meds reviewed verbally with patient.      History of Present Illness:    Louis Bird is a 35 y.o. male with history of bicuspid aortic valve who presents for follow-up.    Patient being seen for bicuspid aortic valve and borderline aortic root dilatation.  Repeat echocardiogram was ordered to 1 year from prior to evaluate any changes in aortic root size and also significant regurgitation or stenosis.  He feels well otherwise, denies any dizziness.  Prior notes  Echocardiogram 11/2020 EF 60 to 65%, bicuspid aortic valve, mild aortic regurgitation, borderline aortic root dilation at 38 mm. CT chest 11/2020, coronary calcium score 0   Past Medical History:  Diagnosis Date   H. pylori infection 09/24/2021   History of kidney stones     Past Surgical History:  Procedure Laterality Date   EXTRACORPOREAL SHOCK WAVE LITHOTRIPSY Left 07/05/2019   Procedure: EXTRACORPOREAL SHOCK WAVE LITHOTRIPSY (ESWL);  Surgeon: Orson Ape, MD;  Location: [facility] ORS;  Service: Urology;  Laterality: Left;   XI ROBOTIC ASSISTED INGUINAL HERNIA REPAIR WITH MESH Bilateral 02/28/2020   Procedure: XI ROBOTIC ASSISTED BILATERAL INGUINAL HERNIA REPAIR WITH MESH;  Surgeon: Leafy Ro, MD;  Location: [facility] ORS;  Service: General;  Laterality: Bilateral;    Current Medications: Current Meds  Medication Sig   bismuth subsalicylate (PEPTO BISMOL) 262 MG/15ML suspension Take 30 mLs by mouth 4 (four) times daily -  before meals and at bedtime.   clarithromycin (BIAXIN) 500 MG tablet Take 1 tablet (500 mg total) by mouth 2 (two) times daily for 14 days. With food   metroNIDAZOLE (FLAGYL) 500 MG  tablet Take 1 tablet (500 mg total) by mouth 3 (three) times daily for 14 days. With food   omeprazole (PRILOSEC) 40 MG capsule Take 1 capsule (40 mg total) by mouth daily for 14 days. 30 minutes before food     Allergies:   Patient has no known allergies.   Social History   Socioeconomic History   Marital status: Married    Spouse name: Not on file   Number of children: Not on file   Years of education: Not on file   Highest education level: Not on file  Occupational History   Not on file  Tobacco Use   Smoking status: Former    Types: Cigarettes    Quit date: 06/24/2008    Years since quitting: 13.4   Smokeless tobacco: Never   Tobacco comments:    smoker x 6 years >1 ppd and quit in 2009  Vaping Use   Vaping Use: Never used  Substance and Sexual Activity   Alcohol use: Yes    Alcohol/week: 5.0 standard drinks    Types: 5 Cans of beer per week   Drug use: Never   Sexual activity: Yes  Other Topics Concern   Not on file  Social History Narrative   Married    2 kids boy 90 y.o and 32 month old baby as of 10/17/19   From [place] lived in [place] since 2013 has lived in [place] in [place]    Bachelors degree Public affairs consultant  Social Determinants of Health   Financial Resource Strain: Not on file  Food Insecurity: Not on file  Transportation Needs: Not on file  Physical Activity: Not on file  Stress: Not on file  Social Connections: Not on file     Family History: The patient's family history includes Diabetes in his father.  ROS:   Please see the history of present illness.     All other systems reviewed and are negative.  EKGs/Labs/Other Studies Reviewed:    The following studies were reviewed today: .  EKG:  EKG is  ordered today.  The ekg ordered today demonstrates sinus bradycardia, heart rate 57, incomplete right bundle branch block.  Recent Labs: 10/20/2021: ALT 21; BUN 17; Creatinine, Ser 0.88; Hemoglobin 14.7; Platelets 193.0; Potassium 4.2;  Sodium 138  Recent Lipid Panel    Component Value Date/Time   CHOL 137 10/20/2021 0750   TRIG 94.0 10/20/2021 0750   HDL 36.50 (L) 10/20/2021 0750   CHOLHDL 4 10/20/2021 0750   VLDL 18.8 10/20/2021 0750   LDLCALC 81 10/20/2021 0750    Physical Exam:    VS:  BP 130/80 (BP Location: Left Arm, Patient Position: Sitting, Cuff Size: Normal)    Pulse (!) 57    Ht 5\' 9"  (1.753 m)    Wt 177 lb (80.3 kg)    SpO2 98%    BMI 26.14 kg/m     Wt Readings from Last 3 Encounters:  12/01/21 177 lb (80.3 kg)  11/03/21 174 lb 9.6 oz (79.2 kg)  12/09/20 179 lb 4 oz (81.3 kg)     GEN:  Well nourished, well developed in no acute distress HEENT: Normal NECK: No JVD; No carotid bruits LYMPHATICS: No lymphadenopathy CARDIAC: RRR, no murmurs, rubs, gallops RESPIRATORY:  Clear to auscultation without rales, wheezing or rhonchi  ABDOMEN: Soft, non-tender, non-distended MUSCULOSKELETAL:  No edema; No deformity  SKIN: Warm and dry NEUROLOGIC:  Alert and oriented x 3 PSYCHIATRIC:  Normal affect   ASSESSMENT:    1. Bicuspid aortic valve   2. Aortic valve insufficiency, etiology of cardiac valve disease unspecified       PLAN:    In order of problems listed above:  Bicuspid aortic valve, being followed serially. Repeat echocardiogram 11/2021 showed normal systolic function, EF 60 to 65%, bicuspid aortic valve, trivial aortic regurgitation, ascending aorta borderline dilated at 38 mm.  Which is stable from a year ago. .  Plan for serial monitoring with repeat echocardiogram in 2 years. Trivial to mild aortic regurgitation.  Serial monitoring with echo as above.  Follow-up in 2 years.  Get echocardiogram prior to follow-up.  Total encounter time more than 35 minutes  Greater than 50% was spent in counseling and coordination of care with the patient    Medication Adjustments/Labs and Tests Ordered: Current medicines are reviewed at length with the patient today.  Concerns regarding medicines are  outlined above.  Orders Placed This Encounter  Procedures   EKG 12-Lead   ECHOCARDIOGRAM COMPLETE    No orders of the defined types were placed in this encounter.   Patient Instructions  Medication Instructions:   Your physician recommends that you continue on your current medications as directed. Please refer to the Current Medication list given to you today.  *If you need a refill on your cardiac medications before your next appointment, please call your pharmacy*   Lab Work:   None ordered   Testing/Procedures:  Your physician has requested that you have an echocardiogram in  2 years. Echocardiography is a painless test that uses sound waves to create images of your heart. It provides your doctor with information about the size and shape of your heart and how well your hearts chambers and valves are working. This procedure takes approximately one hour. There are no restrictions for this procedure.    Follow-Up: At [facility], you and your health needs are our priority.  As part of our continuing mission to provide you with exceptional heart care, we have created designated Provider Care Teams.  These Care Teams include your primary Cardiologist (physician) and Advanced Practice Providers (APPs -  Physician Assistants and Nurse Practitioners) who all work together to provide you with the care you need, when you need it.  We recommend signing up for the patient portal called "MyChart".  Sign up information is provided on this After Visit Summary.  MyChart is used to connect with patients for Virtual Visits (Telemedicine).  Patients are able to view lab/test results, encounter notes, upcoming appointments, etc.  Non-urgent messages can be sent to your provider as well.   To learn more about what you can do with MyChart, go to [email or web address].    Your next appointment:   2 year(s)  The format for your next appointment:   In Person  Provider:   You may see Louis Odea, MD or one of the following Advanced Practice Providers on your designated Care Team:   Nicolasa Ducking, NP Eula Listen, PA-C Cadence Fransico Michael, [place]   Other Instructions    Signed, Louis Odea, MD  12/01/2021 10:08 AM    [facility]

## 2021-12-01 NOTE — Patient Instructions (Signed)
Medication Instructions:   Your physician recommends that you continue on your current medications as directed. Please refer to the Current Medication list given to you today.  *If you need a refill on your cardiac medications before your next appointment, please call your pharmacy*   Lab Work:   None ordered   Testing/Procedures:  Your physician has requested that you have an echocardiogram in 2 years. Echocardiography is a painless test that uses sound waves to create images of your heart. It provides your doctor with information about the size and shape of your heart and how well your hearts chambers and valves are working. This procedure takes approximately one hour. There are no restrictions for this procedure.    Follow-Up: At [facility], you and your health needs are our priority.  As part of our continuing mission to provide you with exceptional heart care, we have created designated Provider Care Teams.  These Care Teams include your primary Cardiologist (physician) and Advanced Practice Providers (APPs -  Physician Assistants and Nurse Practitioners) who all work together to provide you with the care you need, when you need it.  We recommend signing up for the patient portal called "MyChart".  Sign up information is provided on this After Visit Summary.  MyChart is used to connect with patients for Virtual Visits (Telemedicine).  Patients are able to view lab/test results, encounter notes, upcoming appointments, etc.  Non-urgent messages can be sent to your provider as well.   To learn more about what you can do with MyChart, go to [email or web address].    Your next appointment:   2 year(s)  The format for your next appointment:   In Person  Provider:   You may see Debbe Odea, MD or one of the following Advanced Practice Providers on your designated Care Team:   Nicolasa Ducking, NP Eula Listen, PA-C Cadence Fransico Michael, [place]   Other Instructions

## 2021-12-02 NOTE — Telephone Encounter (Signed)
Patient informed and verbalized understanding

## 2022-01-04 ENCOUNTER — Ambulatory Visit (INDEPENDENT_AMBULATORY_CARE_PROVIDER_SITE_OTHER): Payer: 59 | Admitting: Gastroenterology

## 2022-01-04 ENCOUNTER — Encounter: Payer: Self-pay | Admitting: Gastroenterology

## 2022-01-04 VITALS — BP 131/78 | HR 54 | Temp 98.1°F | Ht 69.0 in | Wt 176.4 lb

## 2022-01-04 DIAGNOSIS — N200 Calculus of kidney: Secondary | ICD-10-CM | POA: Insufficient documentation

## 2022-01-04 DIAGNOSIS — A048 Other specified bacterial intestinal infections: Secondary | ICD-10-CM

## 2022-01-04 HISTORY — DX: Calculus of kidney: N20.0

## 2022-01-04 NOTE — Progress Notes (Signed)
Wyline Mood MD, MRCP(U.K) [address]  [address]  [place], [place] [address]  Main: [phone]  Fax: [phone]   Gastroenterology Consultation  Referring Provider:     Allegra Grana, FNP Primary Care Physician:  McLean-Scocuzza, Pasty Spillers, MD Primary Gastroenterologist:  Dr. Wyline Mood  Reason for Consultation:    H pylori infection\        HPI:   Louis Bird is a 36 y.o. y/o male referred for consultation & management  by Dr. Judie Grieve, Pasty Spillers, MD.   The patient contacted his doctor in 09/08/2021 to get tested for H. pylori as his wife was diagnosed with the same.  They both grew up in [place].  Subsequently he was treated for H. pylori and requested a referral to see me for the same.    He states that when he got tested he did not have any GI symptoms but rather had other symptoms such as anxiety, loss of hair which he felt may be related to H. pylori and wanted to discuss  about it today with me. Past Medical History:  Diagnosis Date   H. pylori infection 09/24/2021   History of kidney stones     Past Surgical History:  Procedure Laterality Date   EXTRACORPOREAL SHOCK WAVE LITHOTRIPSY Left 07/05/2019   Procedure: EXTRACORPOREAL SHOCK WAVE LITHOTRIPSY (ESWL);  Surgeon: Orson Ape, MD;  Location: [facility] ORS;  Service: Urology;  Laterality: Left;   XI ROBOTIC ASSISTED INGUINAL HERNIA REPAIR WITH MESH Bilateral 02/28/2020   Procedure: XI ROBOTIC ASSISTED BILATERAL INGUINAL HERNIA REPAIR WITH MESH;  Surgeon: Leafy Ro, MD;  Location: [facility] ORS;  Service: General;  Laterality: Bilateral;    Prior to Admission medications   Medication Sig Start Date End Date Taking? Authorizing Provider  bismuth subsalicylate (PEPTO BISMOL) 262 MG/15ML suspension Take 30 mLs by mouth 4 (four) times daily -  before meals and at bedtime. 11/25/21   McLean-Scocuzza, Pasty Spillers, MD  omeprazole (PRILOSEC) 40 MG capsule Take 1 capsule (40 mg total) by mouth daily for 14 days. 30  minutes before food 11/27/21 12/11/21  McLean-Scocuzza, Pasty Spillers, MD    Family History  Problem Relation Age of Onset   Diabetes Father      Social History   Tobacco Use   Smoking status: Former    Types: Cigarettes    Quit date: 06/24/2008    Years since quitting: 13.5   Smokeless tobacco: Never   Tobacco comments:    smoker x 6 years >1 ppd and quit in 2009  Vaping Use   Vaping Use: Never used  Substance Use Topics   Alcohol use: Yes    Alcohol/week: 5.0 standard drinks    Types: 5 Cans of beer per week   Drug use: Never    Allergies as of 01/04/2022   (No Known Allergies)    Review of Systems:    All systems reviewed and negative except where noted in HPI.   Physical Exam:  There were no vitals taken for this visit. No LMP for male patient. Psych:  Alert and cooperative. Normal mood and affect. General:   Alert,  Well-developed, well-nourished, pleasant and cooperative in NAD Head:  Normocephalic and atraumatic. Eyes:  Sclera clear, no icterus.   Conjunctiva pink.   Neurologic:  Alert and oriented x3;  grossly normal neurologically. Psych:  Alert and cooperative. Normal mood and affect.  Imaging Studies: No results found.  Assessment and Plan:   Louis Bird is a  36 y.o. y/o male has been referred for H pylori infection..  He has been tested and treated for the same.  He had no symptoms.  He was tested because he felt he wanted to make sure he did not have the bacteria as his wife did have it.  I explained to him that it is a known carcinogen and if he tested positive could be prudent to ensure that it has been eradicated with repeat test 6 weeks after completing treatment off PPI for at least a week.  He had no GI symptoms when he initially got tested nor does he have any GI symptoms at this point of time.  I explained to him that in the [place] only test when an individual has dyspeptic symptoms but there are other countries in the world and [place]  where they have screening programs.  H. pylori breath test in 6 weeks to confirm eradication  Follow up in as needed  Dr Wyline Mood MD,MRCP(U.K)

## 2022-01-04 NOTE — Patient Instructions (Signed)
Please come to our office or go to [facility] pharmacy off of [address] with no appointments in 6 weeks to make sure that you no longer have H Pylori bacteria.

## 2022-04-08 LAB — H. PYLORI BREATH TEST: H pylori Breath Test: NEGATIVE

## 2022-10-20 ENCOUNTER — Other Ambulatory Visit: Payer: BC Managed Care – PPO

## 2022-11-03 ENCOUNTER — Ambulatory Visit: Payer: 59 | Admitting: Family Medicine

## 2022-11-03 ENCOUNTER — Encounter: Payer: BC Managed Care – PPO | Admitting: Internal Medicine

## 2022-11-03 ENCOUNTER — Encounter: Payer: Self-pay | Admitting: Family Medicine

## 2022-11-03 VITALS — BP 116/76 | HR 53 | Temp 98.4°F | Ht 69.0 in | Wt 178.4 lb

## 2022-11-03 DIAGNOSIS — Z0000 Encounter for general adult medical examination without abnormal findings: Secondary | ICD-10-CM

## 2022-11-03 DIAGNOSIS — R001 Bradycardia, unspecified: Secondary | ICD-10-CM

## 2022-11-03 DIAGNOSIS — Q231 Congenital insufficiency of aortic valve: Secondary | ICD-10-CM | POA: Diagnosis not present

## 2022-11-03 DIAGNOSIS — E559 Vitamin D deficiency, unspecified: Secondary | ICD-10-CM

## 2022-11-03 DIAGNOSIS — Z114 Encounter for screening for human immunodeficiency virus [HIV]: Secondary | ICD-10-CM

## 2022-11-03 DIAGNOSIS — Q2381 Bicuspid aortic valve: Secondary | ICD-10-CM

## 2022-11-03 DIAGNOSIS — R590 Localized enlarged lymph nodes: Secondary | ICD-10-CM

## 2022-11-03 DIAGNOSIS — Z13220 Encounter for screening for lipoid disorders: Secondary | ICD-10-CM | POA: Diagnosis not present

## 2022-11-03 DIAGNOSIS — Z1159 Encounter for screening for other viral diseases: Secondary | ICD-10-CM

## 2022-11-03 DIAGNOSIS — Z1329 Encounter for screening for other suspected endocrine disorder: Secondary | ICD-10-CM

## 2022-11-03 NOTE — Patient Instructions (Signed)
It was a pleasure meeting you today. Thank you for allowing me to take part in your health care.  Our goals for today as we discussed include:   We will order some labs today.   Please schedule an appointment for lab work 1 week prior to your next visit.  You will need to fast for 12 hours  Please follow-up with PCP in 4 weeks  If you have any questions or concerns, please do not hesitate to call the office at [phone].  I look forward to our next visit and until then take care and stay safe.  Regards,   Dana Allan, MD   [facility]

## 2022-11-03 NOTE — Progress Notes (Signed)
   SUBJECTIVE:   Chief Complaint  Patient presents with   Establish Care    Transfer of Care- Dr. French Ana   HPI Patient presents to clinic to transfer care  No acute concerns today  Bicuspid aortic valve Asymptomatic.  Followed by cardiology.  Serial monitoring with echoes.  Next echo due in 2024.  PERTINENT PMH / PSH: History of H. pylori Bicuspid aortic valve Bradycardia  OBJECTIVE:  BP 116/76 (BP Location: Left Arm, Patient Position: Sitting, Cuff Size: Normal)   Pulse (!) 53   Temp 98.4 F (36.9 C) (Oral)   Ht 5\' 9"  (1.753 m)   Wt 178 lb 6.4 oz (80.9 kg)   SpO2 99%   BMI 26.35 kg/m    Physical Exam Vitals reviewed.  Constitutional:      General: He is not in acute distress.    Appearance: He is normal weight. He is not ill-appearing.  HENT:     Head: Normocephalic.  Eyes:     Conjunctiva/sclera: Conjunctivae normal.  Cardiovascular:     Rate and Rhythm: Normal rate and regular rhythm.     Pulses: Normal pulses.  Pulmonary:     Effort: Pulmonary effort is normal.     Breath sounds: Normal breath sounds.  Abdominal:     General: Bowel sounds are normal.  Musculoskeletal:     Cervical back: Neck supple.  Lymphadenopathy:     Cervical: Cervical adenopathy (incidental finding) present.     Left cervical: Superficial cervical adenopathy (one small mobile and tender) present.  Neurological:     Mental Status: He is alert. Mental status is at baseline.  Psychiatric:        Mood and Affect: Mood normal.        Behavior: Behavior normal.        Thought Content: Thought content normal.        Judgment: Judgment normal.     ASSESSMENT/PLAN:  Lymphadenopathy of left cervical region Assessment & Plan: Incidental finding on physical exam.  Mild tenderness on palpation.  Less likely malignancy given no B signs.  Likely reactive lymph node. Follow-up 4 weeks, if not improved consider imaging  Orders: -     CBC with Differential/Platelet;  Future  Bradycardia Assessment & Plan: Asymptomatic.  Chronic.  Follows with cardiology.  Orders: -     Comprehensive metabolic panel; Future  Lipid screening -     Lipid panel; Future  Encounter for screening for HIV -     HIV Antibody (routine testing w rflx); Future  Encounter for hepatitis C screening test for low risk patient -     Hepatitis C antibody; Future  Thyroid disorder screening -     TSH; Future  Vitamin D deficiency -     VITAMIN D 25 Hydroxy (Vit-D Deficiency, Fractures); Future  Annual physical exam -     Vitamin B12; Future -     Hemoglobin A1c; Future  Bicuspid aortic valve Assessment & Plan: Chronic.  Asymptomatic.  Biannual echoes per cardiology.  Due 2024. Follow-up with cardiology as scheduled    PDMP reviewed  Return in about 4 weeks (around 12/01/2022).  12/03/2022, MD

## 2022-11-16 ENCOUNTER — Other Ambulatory Visit (INDEPENDENT_AMBULATORY_CARE_PROVIDER_SITE_OTHER): Payer: 59

## 2022-11-16 DIAGNOSIS — Z0000 Encounter for general adult medical examination without abnormal findings: Secondary | ICD-10-CM

## 2022-11-16 LAB — CBC WITH DIFFERENTIAL/PLATELET
Basophils Absolute: 0 10*3/uL (ref 0.0–0.1)
Basophils Relative: 0.2 % (ref 0.0–3.0)
Eosinophils Absolute: 0.2 10*3/uL (ref 0.0–0.7)
Eosinophils Relative: 1.6 % (ref 0.0–5.0)
HCT: 47.2 % (ref 39.0–52.0)
Hemoglobin: 15.7 g/dL (ref 13.0–17.0)
Lymphocytes Relative: 20.3 % (ref 12.0–46.0)
Lymphs Abs: 2.6 10*3/uL (ref 0.7–4.0)
MCHC: 33.2 g/dL (ref 30.0–36.0)
MCV: 88.8 fl (ref 78.0–100.0)
Monocytes Absolute: 0.8 10*3/uL (ref 0.1–1.0)
Monocytes Relative: 6.1 % (ref 3.0–12.0)
Neutro Abs: 9.2 10*3/uL — ABNORMAL HIGH (ref 1.4–7.7)
Neutrophils Relative %: 71.8 % (ref 43.0–77.0)
Platelets: 246 10*3/uL (ref 150.0–400.0)
RBC: 5.32 Mil/uL (ref 4.22–5.81)
RDW: 12.8 % (ref 11.5–15.5)
WBC: 12.8 10*3/uL — ABNORMAL HIGH (ref 4.0–10.5)

## 2022-11-16 LAB — COMPREHENSIVE METABOLIC PANEL
ALT: 47 U/L (ref 0–53)
AST: 27 U/L (ref 0–37)
Albumin: 4.4 g/dL (ref 3.5–5.2)
Alkaline Phosphatase: 67 U/L (ref 39–117)
BUN: 16 mg/dL (ref 6–23)
CO2: 30 mEq/L (ref 19–32)
Calcium: 9.7 mg/dL (ref 8.4–10.5)
Chloride: 102 mEq/L (ref 96–112)
Creatinine, Ser: 0.78 mg/dL (ref 0.40–1.50)
GFR: 114.04 mL/min (ref >60.00)
Glucose, Bld: 89 mg/dL (ref 70–99)
Potassium: 4.2 mEq/L (ref 3.5–5.1)
Sodium: 139 mEq/L (ref 135–145)
Total Bilirubin: 0.4 mg/dL (ref 0.2–1.2)
Total Protein: 7.7 g/dL (ref 6.0–8.3)

## 2022-11-16 LAB — LIPID PANEL
Cholesterol: 168 mg/dL (ref 0–200)
HDL: 38 mg/dL — ABNORMAL LOW (ref >39.00)
LDL Cholesterol: 102 mg/dL — ABNORMAL HIGH (ref 0–99)
NonHDL: 129.76
Total CHOL/HDL Ratio: 4
Triglycerides: 138 mg/dL (ref 0.0–149.0)
VLDL: 27.6 mg/dL (ref 0.0–40.0)

## 2022-11-16 LAB — VITAMIN D 25 HYDROXY (VIT D DEFICIENCY, FRACTURES): VITD: 34.64 ng/mL (ref 30.00–100.00)

## 2022-11-16 LAB — VITAMIN B12: Vitamin B-12: 506 pg/mL (ref 211–911)

## 2022-11-16 LAB — TSH: TSH: 2.1 u[IU]/mL (ref 0.35–5.50)

## 2022-11-16 LAB — HEMOGLOBIN A1C: Hgb A1c MFr Bld: 5.9 % (ref 4.6–6.5)

## 2022-11-17 LAB — HEPATITIS C ANTIBODY: Hepatitis C Ab: NONREACTIVE

## 2022-11-17 LAB — HIV ANTIBODY (ROUTINE TESTING W REFLEX): HIV 1&2 Ab, 4th Generation: NONREACTIVE

## 2022-11-20 ENCOUNTER — Encounter: Payer: Self-pay | Admitting: Family Medicine

## 2022-11-20 DIAGNOSIS — Z1329 Encounter for screening for other suspected endocrine disorder: Secondary | ICD-10-CM | POA: Insufficient documentation

## 2022-11-20 DIAGNOSIS — E559 Vitamin D deficiency, unspecified: Secondary | ICD-10-CM | POA: Insufficient documentation

## 2022-11-20 DIAGNOSIS — Z1159 Encounter for screening for other viral diseases: Secondary | ICD-10-CM | POA: Insufficient documentation

## 2022-11-20 DIAGNOSIS — R590 Localized enlarged lymph nodes: Secondary | ICD-10-CM | POA: Insufficient documentation

## 2022-11-20 HISTORY — DX: Encounter for screening for other viral diseases: Z11.59

## 2022-11-20 NOTE — Assessment & Plan Note (Addendum)
Asymptomatic.  Chronic.  Follows with cardiology.

## 2022-11-20 NOTE — Assessment & Plan Note (Signed)
Chronic.  Asymptomatic.  Biannual echoes per cardiology.  Due 2024. Follow-up with cardiology as scheduled

## 2022-11-20 NOTE — Assessment & Plan Note (Addendum)
Incidental finding on physical exam.  Mild tenderness on palpation.  Less likely malignancy given no B signs.  Likely reactive lymph node. Follow-up 4 weeks, if not improved consider imaging

## 2022-12-01 ENCOUNTER — Encounter: Payer: Self-pay | Admitting: Family Medicine

## 2022-12-01 ENCOUNTER — Ambulatory Visit (INDEPENDENT_AMBULATORY_CARE_PROVIDER_SITE_OTHER): Payer: 59 | Admitting: Family Medicine

## 2022-12-01 VITALS — BP 128/82 | HR 54 | Temp 97.5°F | Ht 69.0 in | Wt 175.4 lb

## 2022-12-01 DIAGNOSIS — Z13220 Encounter for screening for lipoid disorders: Secondary | ICD-10-CM

## 2022-12-01 DIAGNOSIS — Z0000 Encounter for general adult medical examination without abnormal findings: Secondary | ICD-10-CM | POA: Diagnosis not present

## 2022-12-01 DIAGNOSIS — E559 Vitamin D deficiency, unspecified: Secondary | ICD-10-CM | POA: Diagnosis not present

## 2022-12-01 DIAGNOSIS — Z1329 Encounter for screening for other suspected endocrine disorder: Secondary | ICD-10-CM

## 2022-12-01 DIAGNOSIS — R590 Localized enlarged lymph nodes: Secondary | ICD-10-CM

## 2022-12-01 NOTE — Patient Instructions (Signed)
It was a pleasure meeting you today. Thank you for allowing me to take part in your health care.  Our goals for today as we discussed include:  I suspect the lump in your neck was a reactive lymph node that has now improved.  Increase cardio activity to increase your good cholesterol, HDL. Avoid trans fats to lower your LDL.  Schedule fasting lab appointment 1 week prior to your next annual visit.  If you have any questions or concerns, please do not hesitate to call the office at [phone].  I look forward to our next visit and until then take care and stay safe.  Regards,   Dana Allan, MD   [facility]

## 2022-12-01 NOTE — Progress Notes (Signed)
   SUBJECTIVE:   Chief Complaint  Patient presents with   Annual Exam   HPI Patient presents to clinic for annual physical.  No acute concerns today.  Labs reviewed and significant for elevated LDL 102.  Discussed nutrition and limiting saturated fats.  At previous visit noted right cervical lymphadenopathy.  Labs significant for mild elevation in WBC and lymphocytes.  Reports had viral URI few days after labs collected.  Symptoms have since resolved.  Denies any fevers, loss of appetite, unintentional weight loss.   PERTINENT PMH / PSH: Bicuspid aortic valve Bradycardia  OBJECTIVE:  BP 128/82   Pulse (!) 54   Temp (!) 97.5 F (36.4 C)   Ht 5\' 9"  (1.753 m)   Wt 175 lb 6.4 oz (79.6 kg)   SpO2 99%   BMI 25.90 kg/m    Physical Exam Vitals reviewed.  Constitutional:      General: He is not in acute distress.    Appearance: Normal appearance. He is normal weight. He is not ill-appearing, toxic-appearing or diaphoretic.  Eyes:     General:        Right eye: No discharge.        Left eye: No discharge.  Neck:     Thyroid: No thyroid mass, thyromegaly or thyroid tenderness.  Cardiovascular:     Rate and Rhythm: Regular rhythm. Bradycardia present.     Heart sounds: Normal heart sounds.  Pulmonary:     Effort: Pulmonary effort is normal.     Breath sounds: Normal breath sounds.  Abdominal:     General: Bowel sounds are normal.  Musculoskeletal:        General: Normal range of motion.     Cervical back: Normal range of motion.  Lymphadenopathy:     Cervical: No cervical adenopathy.  Skin:    General: Skin is warm and dry.  Neurological:     Mental Status: He is alert and oriented to person, place, and time. Mental status is at baseline.  Psychiatric:        Mood and Affect: Mood normal.        Behavior: Behavior normal.        Thought Content: Thought content normal.        Judgment: Judgment normal.     ASSESSMENT/PLAN:  Annual physical exam Assessment &  Plan: Healthy 36 year old male LDL 102.  Low ASCVD risk Discussed lowering saturated fats, increase exercise including cardio and resistance training Declined influenza vaccine Recommend tetanus HIV/hep C C screening completed Follow-up in 1 year for repeat annual physical  Orders: -     Comprehensive metabolic panel; Future -     Hemoglobin A1c; Future -     Vitamin B12; Future  Vitamin D deficiency -     VITAMIN D 25 Hydroxy (Vit-D Deficiency, Fractures); Future  Lipid screening -     Lipid panel; Future  Thyroid disorder screening -     TSH; Future  Lymphadenopathy of left cervical region Assessment & Plan: No cervical lymphadenopathy on exam today.  Recent labs showed elevated WBC and lymphocytes.    Orders: -     CBC with Differential/Platelet; Future   PDMP reviewed  Return in about 1 year (around 12/02/2023) for annual visit with fasting labs 1 week prior.  12/04/2023, MD

## 2022-12-20 ENCOUNTER — Encounter: Payer: Self-pay | Admitting: Family Medicine

## 2022-12-20 DIAGNOSIS — Z0000 Encounter for general adult medical examination without abnormal findings: Secondary | ICD-10-CM | POA: Insufficient documentation

## 2022-12-20 NOTE — Assessment & Plan Note (Addendum)
No cervical lymphadenopathy on exam today.  Recent labs showed elevated WBC and lymphocytes.

## 2022-12-20 NOTE — Assessment & Plan Note (Signed)
Healthy 37 year old male LDL 102.  Low ASCVD risk Discussed lowering saturated fats, increase exercise including cardio and resistance training Declined influenza vaccine Recommend tetanus HIV/hep C C screening completed Follow-up in 1 year for repeat annual physical

## 2023-11-28 ENCOUNTER — Other Ambulatory Visit (INDEPENDENT_AMBULATORY_CARE_PROVIDER_SITE_OTHER): Payer: 59

## 2023-11-28 ENCOUNTER — Encounter: Payer: Self-pay | Admitting: Family Medicine

## 2023-11-28 ENCOUNTER — Other Ambulatory Visit: Payer: Self-pay | Admitting: Family Medicine

## 2023-11-28 DIAGNOSIS — E559 Vitamin D deficiency, unspecified: Secondary | ICD-10-CM | POA: Diagnosis not present

## 2023-11-28 DIAGNOSIS — R590 Localized enlarged lymph nodes: Secondary | ICD-10-CM

## 2023-11-28 DIAGNOSIS — Z1329 Encounter for screening for other suspected endocrine disorder: Secondary | ICD-10-CM | POA: Diagnosis not present

## 2023-11-28 DIAGNOSIS — Z0000 Encounter for general adult medical examination without abnormal findings: Secondary | ICD-10-CM | POA: Diagnosis not present

## 2023-11-28 DIAGNOSIS — Z13220 Encounter for screening for lipoid disorders: Secondary | ICD-10-CM | POA: Diagnosis not present

## 2023-11-28 LAB — CBC WITH DIFFERENTIAL/PLATELET
Basophils Absolute: 0.1 10*3/uL (ref 0.0–0.1)
Basophils Relative: 0.8 % (ref 0.0–3.0)
Eosinophils Absolute: 0.2 10*3/uL (ref 0.0–0.7)
Eosinophils Relative: 2 % (ref 0.0–5.0)
HCT: 48.9 % (ref 39.0–52.0)
Hemoglobin: 16 g/dL (ref 13.0–17.0)
Lymphocytes Relative: 26.9 % (ref 12.0–46.0)
Lymphs Abs: 2.7 10*3/uL (ref 0.7–4.0)
MCHC: 32.8 g/dL (ref 30.0–36.0)
MCV: 91.8 fL (ref 78.0–100.0)
Monocytes Absolute: 0.8 10*3/uL (ref 0.1–1.0)
Monocytes Relative: 7.5 % (ref 3.0–12.0)
Neutro Abs: 6.3 10*3/uL (ref 1.4–7.7)
Neutrophils Relative %: 62.8 % (ref 43.0–77.0)
Platelets: 228 10*3/uL (ref 150.0–400.0)
RBC: 5.33 Mil/uL (ref 4.22–5.81)
RDW: 13.1 % (ref 11.5–15.5)
WBC: 10 10*3/uL (ref 4.0–10.5)

## 2023-11-28 LAB — HEMOGLOBIN A1C: Hgb A1c MFr Bld: 5.6 % (ref 4.6–6.5)

## 2023-11-28 LAB — VITAMIN B12: Vitamin B-12: 606 pg/mL (ref 211–911)

## 2023-11-28 LAB — COMPREHENSIVE METABOLIC PANEL
ALT: 46 U/L (ref 0–53)
AST: 32 U/L (ref 0–37)
Albumin: 4.4 g/dL (ref 3.5–5.2)
Alkaline Phosphatase: 66 U/L (ref 39–117)
BUN: 14 mg/dL (ref 6–23)
CO2: 30 meq/L (ref 19–32)
Calcium: 9.6 mg/dL (ref 8.4–10.5)
Chloride: 99 meq/L (ref 96–112)
Creatinine, Ser: 0.97 mg/dL (ref 0.40–1.50)
GFR: 99.1 mL/min (ref >60.00)
Glucose, Bld: 102 mg/dL — ABNORMAL HIGH (ref 70–99)
Potassium: 3.8 meq/L (ref 3.5–5.1)
Sodium: 138 meq/L (ref 135–145)
Total Bilirubin: 0.6 mg/dL (ref 0.2–1.2)
Total Protein: 7.8 g/dL (ref 6.0–8.3)

## 2023-11-28 LAB — VITAMIN D 25 HYDROXY (VIT D DEFICIENCY, FRACTURES): VITD: 26.75 ng/mL — ABNORMAL LOW (ref 30.00–100.00)

## 2023-11-28 LAB — LIPID PANEL
Cholesterol: 192 mg/dL (ref 0–200)
HDL: 42.3 mg/dL (ref >39.00)
LDL Cholesterol: 124 mg/dL — ABNORMAL HIGH (ref 0–99)
NonHDL: 150.11
Total CHOL/HDL Ratio: 5
Triglycerides: 133 mg/dL (ref 0.0–149.0)
VLDL: 26.6 mg/dL (ref 0.0–40.0)

## 2023-11-28 LAB — TSH: TSH: 1.66 u[IU]/mL (ref 0.35–5.50)

## 2023-11-28 MED ORDER — VITAMIN D (ERGOCALCIFEROL) 1.25 MG (50000 UNIT) PO CAPS: 50000.0000 [IU] | ORAL_CAPSULE | ORAL | 1 refills | Status: AC

## 2023-12-05 ENCOUNTER — Encounter: Payer: Self-pay | Admitting: Family Medicine

## 2023-12-05 ENCOUNTER — Ambulatory Visit (INDEPENDENT_AMBULATORY_CARE_PROVIDER_SITE_OTHER): Payer: 59 | Admitting: Family Medicine

## 2023-12-05 VITALS — BP 118/70 | HR 55 | Temp 98.2°F | Resp 18 | Ht 67.75 in | Wt 181.1 lb

## 2023-12-05 DIAGNOSIS — R7309 Other abnormal glucose: Secondary | ICD-10-CM

## 2023-12-05 DIAGNOSIS — E559 Vitamin D deficiency, unspecified: Secondary | ICD-10-CM | POA: Diagnosis not present

## 2023-12-05 DIAGNOSIS — Z13220 Encounter for screening for lipoid disorders: Secondary | ICD-10-CM

## 2023-12-05 DIAGNOSIS — Z0000 Encounter for general adult medical examination without abnormal findings: Secondary | ICD-10-CM

## 2023-12-05 DIAGNOSIS — E785 Hyperlipidemia, unspecified: Secondary | ICD-10-CM

## 2023-12-05 NOTE — Progress Notes (Unsigned)
SUBJECTIVE:   Chief Complaint  Patient presents with   Annual Exam   HPI Presents for annual visit  PERTINENT PMH / PSH: As above  OBJECTIVE:  BP 118/70   Pulse (!) 55   Temp 98.2 F (36.8 C)   Resp 18   Ht 5' 7.75" (1.721 m)   Wt 181 lb 2 oz (82.2 kg)   SpO2 99%   BMI 27.74 kg/m    Physical Exam Vitals reviewed.  HENT:     Head: Normocephalic.     Right Ear: Tympanic membrane, ear canal and external ear normal.     Left Ear: Tympanic membrane, ear canal and external ear normal.     Nose: Nose normal.     Mouth/Throat:     Mouth: Mucous membranes are moist.  Eyes:     Conjunctiva/sclera: Conjunctivae normal.     Pupils: Pupils are equal, round, and reactive to light.  Neck:     Thyroid: No thyromegaly or thyroid tenderness.     Vascular: No carotid bruit.  Cardiovascular:     Rate and Rhythm: Normal rate and regular rhythm.     Pulses: Normal pulses.     Heart sounds: Normal heart sounds.  Pulmonary:     Effort: Pulmonary effort is normal.     Breath sounds: Normal breath sounds.  Abdominal:     General: Abdomen is flat. Bowel sounds are normal.     Palpations: Abdomen is soft.  Musculoskeletal:        General: Normal range of motion.     Cervical back: Normal range of motion and neck supple.     Right lower leg: No edema.     Left lower leg: No edema.  Lymphadenopathy:     Cervical: No cervical adenopathy.  Neurological:     Mental Status: He is alert.  Psychiatric:        Mood and Affect: Mood normal.        Behavior: Behavior normal.        Thought Content: Thought content normal.        Judgment: Judgment normal.        12/05/2023    9:38 AM 12/01/2022   10:07 AM 11/03/2022   11:15 AM 11/03/2021    3:12 PM 10/31/2020    9:26 AM  Depression screen PHQ 2/9  Decreased Interest 0 0 0 0 0  Down, Depressed, Hopeless 0 0 0 0 0  PHQ - 2 Score 0 0 0 0 0  Altered sleeping 0 0     Tired, decreased energy 0 0     Change in appetite 0 0      Feeling bad or failure about yourself  0 0     Trouble concentrating 0 0     Moving slowly or fidgety/restless 0 0     Suicidal thoughts 0 0     PHQ-9 Score 0 0     Difficult doing work/chores Not difficult at all Not difficult at all         12/05/2023    9:38 AM 12/01/2022   10:08 AM 10/31/2020    9:26 AM  GAD 7 : Generalized Anxiety Score  Nervous, Anxious, on Edge 0 0 0  Control/stop worrying 0 0 0  Worry too much - different things 0 0 0  Trouble relaxing 0 0 0  Restless 0 0 0  Easily annoyed or irritable 0 0 0  Afraid - awful might happen 0 0  0  Total GAD 7 Score 0 0 0  Anxiety Difficulty Not difficult at all  Not difficult at all    ASSESSMENT/PLAN:  There are no diagnoses linked to this encounter. PDMP reviewed***  No follow-ups on file.  Dana Allan, MD

## 2023-12-05 NOTE — Patient Instructions (Addendum)
It was a pleasure meeting you today. Thank you for allowing me to take part in your health care.  Our goals for today as we discussed include:    This is a list of the screening recommended for you and due dates:  Health Maintenance  Topic Date Due   COVID-19 Vaccine (4 - 2024-25 season) 08/21/2023   Flu Shot  03/19/2024*   Hepatitis C Screening  Completed   HIV Screening  Completed   HPV Vaccine  Aged Out   DTaP/Tdap/Td vaccine  Discontinued  *Topic was postponed. The date shown is not the original due date.    Schedule lab appointment prior to visit. Follow up in Jan 2026 for next annual   If you have any questions or concerns, please do not hesitate to call the office at [phone].  I look forward to our next visit and until then take care and stay safe.  Regards,   Dana Allan, MD   [facility]

## 2023-12-08 ENCOUNTER — Encounter: Payer: Self-pay | Admitting: Family Medicine

## 2023-12-08 DIAGNOSIS — R7309 Other abnormal glucose: Secondary | ICD-10-CM | POA: Insufficient documentation

## 2023-12-08 NOTE — Assessment & Plan Note (Signed)
LDL cholesterol elevated at 124 despite regular exercise and a diet that is reportedly healthy. Patient consumes a significant amount of bacon, which is high in saturated fats. -Advise patient to reduce intake of bacon and other high saturated fat foods. -Encourage patient to increase intake of vegetables, healthy fish, whole grains, and fiber-rich foods. -Consider adding psyllium for its cholesterol-lowering properties if patient experiences constipation.

## 2023-12-08 NOTE — Assessment & Plan Note (Addendum)
Healthy 37 year old male Declined influenza vaccine Recommend tetanus HIV/Hep C screening completed Vaccines up to date Annual physical exam to be moved to January 2026 per patient's request.

## 2023-12-08 NOTE — Assessment & Plan Note (Signed)
Vitamin D level slightly low despite supplementation. -Continue high-dose Vitamin D supplementation for another six months, then switch to over-the-counter dose.

## 2024-04-18 ENCOUNTER — Telehealth: Payer: Self-pay | Admitting: Cardiology

## 2024-04-18 NOTE — Telephone Encounter (Signed)
 Pt requesting order for Echo if possible before appt on 5/14. He would like a c/b regarding this matter. Please advise

## 2024-04-19 ENCOUNTER — Other Ambulatory Visit: Payer: Self-pay

## 2024-04-19 DIAGNOSIS — Q2381 Bicuspid aortic valve: Secondary | ICD-10-CM

## 2024-05-02 ENCOUNTER — Ambulatory Visit: Admitting: Physician Assistant

## 2024-05-22 ENCOUNTER — Ambulatory Visit: Attending: Cardiology

## 2024-05-22 DIAGNOSIS — Q2381 Bicuspid aortic valve: Secondary | ICD-10-CM

## 2024-05-22 LAB — ECHOCARDIOGRAM COMPLETE
AR max vel: 1.85 cm2
AV Area VTI: 1.89 cm2
AV Area mean vel: 2.01 cm2
AV Mean grad: 6 mmHg
AV Peak grad: 12.5 mmHg
Ao pk vel: 1.77 m/s
Area-P 1/2: 4.39 cm2
S' Lateral: 3.31 cm

## 2024-05-23 ENCOUNTER — Ambulatory Visit: Admitting: Cardiology

## 2024-05-23 ENCOUNTER — Ambulatory Visit: Payer: Self-pay | Admitting: Cardiology

## 2024-05-23 ENCOUNTER — Encounter: Payer: Self-pay | Admitting: Cardiology

## 2024-05-23 VITALS — BP 128/82 | HR 50 | Ht 69.0 in | Wt 189.2 lb

## 2024-05-23 DIAGNOSIS — I351 Nonrheumatic aortic (valve) insufficiency: Secondary | ICD-10-CM | POA: Diagnosis not present

## 2024-05-23 DIAGNOSIS — Q2381 Bicuspid aortic valve: Secondary | ICD-10-CM

## 2024-05-23 NOTE — Patient Instructions (Signed)
 Medication Instructions:  Your physician recommends that you continue on your current medications as directed. Please refer to the Current Medication list given to you today.   *If you need a refill on your cardiac medications before your next appointment, please call your pharmacy*  Lab Work: No labs ordered today  If you have labs (blood work) drawn today and your tests are completely normal, you will receive your results only by: MyChart Message (if you have MyChart) OR A paper copy in the mail If you have any lab test that is abnormal or we need to change your treatment, we will call you to review the results.  Testing/Procedures: Your physician has requested that you have an echocardiogram. Echocardiography is a painless test that uses sound waves to create images of your heart. It provides your doctor with information about the size and shape of your heart and how well your heart's chambers and valves are working.   You may receive an ultrasound enhancing agent through an IV if needed to better visualize your heart during the echo. This procedure takes approximately one hour.  There are no restrictions for this procedure.  This will take place at [address] [facility] [address]) [address], [place] [address]  Please note: We ask at that you not bring children with you during ultrasound (echo/ vascular) testing. Due to room size and safety concerns, children are not allowed in the ultrasound rooms during exams. Our front office staff cannot provide observation of children in our lobby area while testing is being conducted. An adult accompanying a patient to their appointment will only be allowed in the ultrasound room at the discretion of the ultrasound technician under special circumstances. We apologize for any inconvenience.   Follow-Up: At [facility], you and your health needs are our priority.  As part of our continuing mission to provide you with exceptional heart  care, our providers are all part of one team.  This team includes your primary Cardiologist (physician) and Advanced Practice Providers or APPs (Physician Assistants and Nurse Practitioners) who all work together to provide you with the care you need, when you need it.  Your next appointment:   18 months echocardiogram  Follow up afterwards  Provider:   You may see Constancia Delton, MD or one of the following Advanced Practice Providers on your designated Care Team:   Laneta Pintos, NP Gildardo Labrador, PA-C Varney Gentleman, PA-C Cadence [place], PA-C Ronald Cockayne, NP Morey Ar, NP    We recommend signing up for the patient portal called "MyChart".  Sign up information is provided on this After Visit Summary.  MyChart is used to connect with patients for Virtual Visits (Telemedicine).  Patients are able to view lab/test results, encounter notes, upcoming appointments, etc.  Non-urgent messages can be sent to your provider as well.   To learn more about what you can do with MyChart, go to [email or web address].

## 2024-05-23 NOTE — Progress Notes (Signed)
 Cardiology Office Note:    Date:  05/23/2024   ID:  Louis Bird, DOB January 23, 1986, MRN [id number]  PCP:  Louis Gaw, MD  Cardiologist:  Louis Delton, MD  Electrophysiologist:  None   Referring MD: Louis Gaw, MD   Chief Complaint  Patient presents with   Follow-up    2 year follow up visit. Patient is doing well on today. Meds reviewed.      History of Present Illness:    Louis Bird is a 38 y.o. male with history of bicuspid aortic valve who presents for follow-up.    Doing ok, denies chest pain or shortness of breath. Feels well has no concerns at this time. Had echo performed yesterday to evaluate aortic size and valve for any progression of disease.  Prior notes  Echocardiogram 11/2020 EF 60 to 65%, bicuspid aortic valve, mild aortic regurgitation, borderline aortic root dilation at 38 mm. CT chest 11/2020, coronary calcium score 0   Past Medical History:  Diagnosis Date   Encounter for hepatitis C screening test for low risk patient 11/20/2022   H. pylori infection 09/24/2021   H. pylori infection 11/03/2021   igG lab 09/24/21      History of kidney stones    Kidney stone 01/04/2022    Past Surgical History:  Procedure Laterality Date   EXTRACORPOREAL SHOCK WAVE LITHOTRIPSY Left 07/05/2019   Procedure: EXTRACORPOREAL SHOCK WAVE LITHOTRIPSY (ESWL);  Surgeon: Louis Cambridge, MD;  Location: [facility] ORS;  Service: Urology;  Laterality: Left;   XI ROBOTIC ASSISTED INGUINAL HERNIA REPAIR WITH MESH Bilateral 02/28/2020   Procedure: XI ROBOTIC ASSISTED BILATERAL INGUINAL HERNIA REPAIR WITH MESH;  Surgeon: Louis Alma, MD;  Location: [facility] ORS;  Service: General;  Laterality: Bilateral;    Current Medications: Current Meds  Medication Sig   Vitamin D , Ergocalciferol , (DRISDOL ) 1.25 MG (50000 UNIT) CAPS capsule Take 1 capsule (50,000 Units total) by mouth every 7 (seven) days.     Allergies:   Patient has no known allergies.   Social History   Socioeconomic  History   Marital status: Married    Spouse name: Not on file   Number of children: Not on file   Years of education: Not on file   Highest education level: Not on file  Occupational History   Not on file  Tobacco Use   Smoking status: Former    Current packs/day: 0.00    Types: Cigarettes    Quit date: 06/24/2008    Years since quitting: 15.9   Smokeless tobacco: Never   Tobacco comments:    smoker x 6 years >1 ppd and quit in 2009  Vaping Use   Vaping status: Never Used  Substance and Sexual Activity   Alcohol use: Yes    Alcohol/week: 5.0 standard drinks of alcohol    Types: 5 Cans of beer per week   Drug use: Never   Sexual activity: Yes  Other Topics Concern   Not on file  Social History Narrative   Married    2 kids boy 65 y.o and 11 month old baby as of 10/17/19   From [place] lived in [place]  since 2013 has lived in [place] in [place]    Bachelors degree Public affairs consultant       Social Drivers of Corporate investment banker Strain: Not on file  Food Insecurity: Not on file  Transportation Needs: Not on file  Physical Activity: Not on file  Stress: Not on file  Social Connections: Not on  file     Family History: The patient's family history includes Diabetes in his father.  ROS:   Please see the history of present illness.     All other systems reviewed and are negative.  EKGs/Labs/Other Studies Reviewed:    The following studies were reviewed today: .  EKG Interpretation Date/Time:  Wednesday May 23 2024 11:47:20 EDT Ventricular Rate:  50 PR Interval:  150 QRS Duration:  108 QT Interval:  456 QTC Calculation: 415 R Axis:   -4  Text Interpretation: Sinus bradycardia Incomplete right bundle branch block Possible Inferior infarct , age undetermined Confirmed by Louis Bird ([id number]) on 05/23/2024 11:54:44 AM    Recent Labs: 11/28/2023: ALT 46; BUN 14; Creatinine, Ser 0.97; Hemoglobin 16.0; Platelets 228.0; Potassium 3.8; Sodium 138; TSH 1.66   Recent Lipid Panel    Component Value Date/Time   CHOL 192 11/28/2023 0722   TRIG 133.0 11/28/2023 0722   HDL 42.30 11/28/2023 0722   CHOLHDL 5 11/28/2023 0722   VLDL 26.6 11/28/2023 0722   LDLCALC 124 (H) 11/28/2023 0722    Physical Exam:    VS:  BP 128/82   Pulse (!) 50   Ht 5\' 9"  (1.753 m)   Wt 189 lb 3.2 oz (85.8 kg)   SpO2 96%   BMI 27.94 kg/m     Wt Readings from Last 3 Encounters:  05/23/24 189 lb 3.2 oz (85.8 kg)  12/05/23 181 lb 2 oz (82.2 kg)  12/01/22 175 lb 6.4 oz (79.6 kg)     GEN:  Well nourished, well developed in no acute distress HEENT: Normal NECK: No JVD; No carotid bruits CARDIAC: RRR, no murmurs, rubs, gallops RESPIRATORY:  Clear to auscultation without rales, wheezing or rhonchi  ABDOMEN: Soft, non-tender, non-distended MUSCULOSKELETAL:  No edema; No deformity  SKIN: Warm and dry NEUROLOGIC:  Alert and oriented x 3 PSYCHIATRIC:  Normal affect   ASSESSMENT:    1. Bicuspid aortic valve   2. Aortic valve insufficiency, etiology of cardiac valve disease unspecified    PLAN:    In order of problems listed above:  Bicuspid aortic valve, repeat echo 05/22/2024 EF 55 to 60%, mild ascending aorta dilatation measuring 4.0 cm, normal aortic root.  Overall no significant change from prior echo in 2022.  Louis Bird  Plan for serial monitoring with repeat echocardiogram in 18 months. Trivial to mild aortic regurgitation.  Repeat echo showing trivial AR, no significant change.  Serial monitoring with echo as above.  Follow-up in 12-18 months.  Get echocardiogram prior to follow-up.   Medication Adjustments/Labs and Tests Ordered: Current medicines are reviewed at length with the patient today.  Concerns regarding medicines are outlined above.  Orders Placed This Encounter  Procedures   EKG 12-Lead   ECHOCARDIOGRAM COMPLETE    No orders of the defined types were placed in this encounter.    Patient Instructions  Medication Instructions:  Your physician  recommends that you continue on your current medications as directed. Please refer to the Current Medication list given to you today.   *If you need a refill on your cardiac medications before your next appointment, please call your pharmacy*  Lab Work: No labs ordered today  If you have labs (blood work) drawn today and your tests are completely normal, you will receive your results only by: MyChart Message (if you have MyChart) OR A paper copy in the mail If you have any lab test that is abnormal or we need to change your treatment, we will  call you to review the results.  Testing/Procedures: Your physician has requested that you have an echocardiogram. Echocardiography is a painless test that uses sound waves to create images of your heart. It provides your doctor with information about the size and shape of your heart and how well your heart's chambers and valves are working.   You may receive an ultrasound enhancing agent through an IV if needed to better visualize your heart during the echo. This procedure takes approximately one hour.  There are no restrictions for this procedure.  This will take place at [address] [facility] [address]) [address], [place] [address]  Please note: We ask at that you not bring children with you during ultrasound (echo/ vascular) testing. Due to room size and safety concerns, children are not allowed in the ultrasound rooms during exams. Our front office staff cannot provide observation of children in our lobby area while testing is being conducted. An adult accompanying a patient to their appointment will only be allowed in the ultrasound room at the discretion of the ultrasound technician under special circumstances. We apologize for any inconvenience.   Follow-Up: At [facility], you and your health needs are our priority.  As part of our continuing mission to provide you with exceptional heart care, our providers are all part of one team.   This team includes your primary Cardiologist (physician) and Advanced Practice Providers or APPs (Physician Assistants and Nurse Practitioners) who all work together to provide you with the care you need, when you need it.  Your next appointment:   18 months echocardiogram  Follow up afterwards  Provider:   You may see Louis Delton, MD or one of the following Advanced Practice Providers on your designated Care Team:   Laneta Pintos, NP Gildardo Labrador, PA-C Varney Gentleman, PA-C Cadence [place], PA-C Ronald Cockayne, NP Morey Ar, NP    We recommend signing up for the patient portal called "MyChart".  Sign up information is provided on this After Visit Summary.  MyChart is used to connect with patients for Virtual Visits (Telemedicine).  Patients are able to view lab/test results, encounter notes, upcoming appointments, etc.  Non-urgent messages can be sent to your provider as well.   To learn more about what you can do with MyChart, go to [email or web address].     Signed, Louis Delton, MD  05/23/2024 1:54 PM    [facility]

## 2024-11-12 ENCOUNTER — Telehealth: Payer: Self-pay

## 2024-11-12 NOTE — Telephone Encounter (Signed)
 Copied from CRM [id number]. Topic: Appointments - Scheduling Inquiry for Clinic >> Nov 12, 2024  1:32 PM Dedra B wrote: Reason for CRM: Pt was scheduled for physical with Glenys Ferrari in 12/2024. Pt called to schedule TOC visit with Dr. Abbey (he prefers to see a physician). Her next available is 02/2025. Pls let pt know if it's possible to be seen sooner. >> Nov 12, 2024  1:36 PM Dedra B wrote: Did not mean to send to clinical pool.   I spoke with patient and scheduled a TOC appointment for him with Dr. Kalpana Bair on 03/15/2025.

## 2025-01-07 ENCOUNTER — Other Ambulatory Visit: Payer: 59

## 2025-01-14 ENCOUNTER — Encounter: Payer: 59 | Admitting: Family Medicine

## 2025-03-15 ENCOUNTER — Encounter

## 2025-11-22 ENCOUNTER — Other Ambulatory Visit
